# Patient Record
Sex: Female | Born: 1965 | Race: White | Hispanic: No | Marital: Married | Smoking: Former smoker
Health system: Southern US, Community
[De-identification: ages and names within clinical notes are randomized; demographics above are authoritative.]

## PROBLEM LIST (undated history)

## (undated) DIAGNOSIS — K219 Gastro-esophageal reflux disease without esophagitis: Secondary | ICD-10-CM

## (undated) DIAGNOSIS — I10 Essential (primary) hypertension: Secondary | ICD-10-CM

## (undated) DIAGNOSIS — IMO0002 Reserved for concepts with insufficient information to code with codable children: Secondary | ICD-10-CM

## (undated) DIAGNOSIS — R112 Nausea with vomiting, unspecified: Secondary | ICD-10-CM

## (undated) DIAGNOSIS — J45909 Unspecified asthma, uncomplicated: Secondary | ICD-10-CM

## (undated) DIAGNOSIS — Z9889 Other specified postprocedural states: Secondary | ICD-10-CM

## (undated) HISTORY — PX: ABDOMINAL HYSTERECTOMY: SHX81

## (undated) HISTORY — PX: ELBOW SURGERY: SHX618

## (undated) HISTORY — PX: BACK SURGERY: SHX140

---

## 2007-08-06 ENCOUNTER — Emergency Department (HOSPITAL_COMMUNITY): Admission: EM | Admit: 2007-08-06 | Discharge: 2007-08-06 | Payer: Self-pay | Admitting: Emergency Medicine

## 2008-09-02 ENCOUNTER — Emergency Department (HOSPITAL_COMMUNITY): Admission: EM | Admit: 2008-09-02 | Discharge: 2008-09-02 | Payer: Self-pay | Admitting: Emergency Medicine

## 2009-02-13 ENCOUNTER — Ambulatory Visit: Payer: Self-pay | Admitting: Diagnostic Radiology

## 2009-02-13 ENCOUNTER — Emergency Department (HOSPITAL_BASED_OUTPATIENT_CLINIC_OR_DEPARTMENT_OTHER): Admission: EM | Admit: 2009-02-13 | Discharge: 2009-02-13 | Payer: Self-pay | Admitting: Emergency Medicine

## 2009-08-24 ENCOUNTER — Ambulatory Visit: Payer: Self-pay | Admitting: Diagnostic Radiology

## 2009-08-24 ENCOUNTER — Emergency Department (HOSPITAL_BASED_OUTPATIENT_CLINIC_OR_DEPARTMENT_OTHER): Admission: EM | Admit: 2009-08-24 | Discharge: 2009-08-24 | Payer: Self-pay | Admitting: Emergency Medicine

## 2010-04-15 ENCOUNTER — Emergency Department (HOSPITAL_BASED_OUTPATIENT_CLINIC_OR_DEPARTMENT_OTHER): Admission: EM | Admit: 2010-04-15 | Discharge: 2010-04-15 | Payer: Self-pay | Admitting: Emergency Medicine

## 2010-09-04 ENCOUNTER — Emergency Department (HOSPITAL_COMMUNITY): Admission: EM | Admit: 2010-09-04 | Discharge: 2010-09-04 | Payer: Self-pay | Admitting: Emergency Medicine

## 2011-03-09 LAB — COMPREHENSIVE METABOLIC PANEL
CO2: 25 mEq/L (ref 19–32)
Calcium: 9.1 mg/dL (ref 8.4–10.5)
Creatinine, Ser: 0.48 mg/dL (ref 0.4–1.2)
GFR calc non Af Amer: >60 mL/min (ref >60)
Glucose, Bld: 96 mg/dL (ref 70–99)
Total Protein: 7.1 g/dL (ref 6.0–8.3)

## 2011-03-09 LAB — CBC
HCT: 40 % (ref 36.0–46.0)
Hemoglobin: 14.1 g/dL (ref 12.0–15.0)
MCH: 34 pg (ref 26.0–34.0)
MCHC: 35.4 g/dL (ref 30.0–36.0)
RDW: 13.7 % (ref 11.5–15.5)

## 2011-03-09 LAB — DIFFERENTIAL
Lymphocytes Relative: 30 % (ref 12–46)
Lymphs Abs: 3.3 10*3/uL (ref 0.7–4.0)
Monocytes Relative: 5 % (ref 3–12)
Neutro Abs: 6.6 10*3/uL (ref 1.7–7.7)
Neutrophils Relative %: 61 % (ref 43–77)

## 2011-03-09 LAB — LIPASE, BLOOD: Lipase: 32 U/L (ref 11–59)

## 2011-03-14 LAB — URINALYSIS, ROUTINE W REFLEX MICROSCOPIC
Bilirubin Urine: NEGATIVE
Nitrite: NEGATIVE
Specific Gravity, Urine: 1.022 (ref 1.005–1.030)
Urobilinogen, UA: 0.2 mg/dL (ref 0.0–1.0)

## 2011-03-14 LAB — PREGNANCY, URINE: Preg Test, Ur: NEGATIVE

## 2011-04-01 LAB — POCT CARDIAC MARKERS

## 2011-04-01 LAB — D-DIMER, QUANTITATIVE: D-Dimer, Quant: <0.22 ug/mL-FEU (ref 0.00–0.48)

## 2011-09-27 LAB — URINALYSIS, ROUTINE W REFLEX MICROSCOPIC
Glucose, UA: NEGATIVE
Hgb urine dipstick: NEGATIVE
Ketones, ur: NEGATIVE
pH: 6.5

## 2012-02-15 ENCOUNTER — Ambulatory Visit (HOSPITAL_BASED_OUTPATIENT_CLINIC_OR_DEPARTMENT_OTHER)
Admission: RE | Admit: 2012-02-15 | Discharge: 2012-02-15 | Disposition: A | Discharge status: Home / Self Care | Payer: Private Health Insurance - Indemnity | Source: Ambulatory Visit | Attending: Family Medicine | Admitting: Family Medicine

## 2012-02-15 ENCOUNTER — Other Ambulatory Visit (HOSPITAL_BASED_OUTPATIENT_CLINIC_OR_DEPARTMENT_OTHER): Payer: Self-pay | Admitting: Family Medicine

## 2012-02-15 DIAGNOSIS — R1011 Right upper quadrant pain: Secondary | ICD-10-CM | POA: Insufficient documentation

## 2012-08-07 ENCOUNTER — Emergency Department (HOSPITAL_BASED_OUTPATIENT_CLINIC_OR_DEPARTMENT_OTHER)
Admission: EM | Admit: 2012-08-07 | Discharge: 2012-08-07 | Disposition: A | Discharge status: Home / Self Care | Payer: Private Health Insurance - Indemnity | Attending: Emergency Medicine | Admitting: Emergency Medicine

## 2012-08-07 ENCOUNTER — Encounter (HOSPITAL_BASED_OUTPATIENT_CLINIC_OR_DEPARTMENT_OTHER): Payer: Self-pay | Admitting: Emergency Medicine

## 2012-08-07 DIAGNOSIS — M436 Torticollis: Secondary | ICD-10-CM

## 2012-08-07 DIAGNOSIS — F17200 Nicotine dependence, unspecified, uncomplicated: Secondary | ICD-10-CM | POA: Insufficient documentation

## 2012-08-07 DIAGNOSIS — Z90710 Acquired absence of both cervix and uterus: Secondary | ICD-10-CM | POA: Insufficient documentation

## 2012-08-07 DIAGNOSIS — M549 Dorsalgia, unspecified: Secondary | ICD-10-CM | POA: Insufficient documentation

## 2012-08-07 HISTORY — DX: Reserved for concepts with insufficient information to code with codable children: IMO0002

## 2012-08-07 MED ORDER — MELOXICAM 7.5 MG PO TABS: ORAL_TABLET | ORAL | Status: DC

## 2012-08-07 MED ORDER — HYDROCODONE-ACETAMINOPHEN 10-325 MG PO TABS: 1.0000 | ORAL_TABLET | ORAL | Status: AC | PRN

## 2012-08-07 MED ORDER — DIAZEPAM 5 MG PO TABS: ORAL_TABLET | ORAL | Status: DC

## 2012-08-07 NOTE — ED Notes (Signed)
Pt woke up 2 days ago with pain in the left side of her neck.  The next morning it was worse and going into her back.  This morning sts "it took me 45 min to get oob".  Pain going into her left shoulder.

## 2012-08-07 NOTE — ED Provider Notes (Signed)
History     CSN: [id number]  Arrival date & time 08/07/12  1633   First MD Initiated Contact with Patient 08/07/12 1637      Chief Complaint  Patient presents with  . Neck Pain  . Back Pain    (Consider location/radiation/quality/duration/timing/severity/associated sxs/prior treatment) Patient is a 46 y.o. female presenting with neck pain and back pain. The history is provided by the patient.  Neck Pain  This is a new problem. The current episode started more than 2 days ago. The problem occurs constantly. The problem has been gradually worsening. There has been no fever. The pain is present in the left side. The quality of the pain is described as shooting and aching. The pain radiates to the left scapula. The pain is severe. Exacerbated by: sneezing, and certain positions. The pain is the same all the time. Pertinent negatives include no photophobia, no chest pain, no numbness, no headaches, no bowel incontinence and no bladder incontinence.  Back Pain  Pertinent negatives include no chest pain, no numbness, no headaches, no abdominal pain, no bowel incontinence, no bladder incontinence and no dysuria.    Past Medical History  Diagnosis Date  . Ulcer     Past Surgical History  Procedure Date  . Abdominal hysterectomy     No family history on file.  History  Substance Use Topics  . Smoking status: Current Everyday Smoker -- 1.0 packs/day  . Smokeless tobacco: Never Used  . Alcohol Use: No    OB History    Grav Para Term Preterm Abortions TAB SAB Ect Mult Living                  Review of Systems  Constitutional: Negative for activity change.       All ROS Neg except as noted in HPI  HENT: Positive for neck pain. Negative for nosebleeds.   Eyes: Negative for photophobia and discharge.  Respiratory: Negative for cough, shortness of breath and wheezing.   Cardiovascular: Negative for chest pain and palpitations.  Gastrointestinal: Negative for abdominal pain,  blood in stool and bowel incontinence.  Genitourinary: Negative for bladder incontinence, dysuria, frequency and hematuria.  Musculoskeletal: Positive for back pain. Negative for arthralgias.  Skin: Negative.   Neurological: Negative for dizziness, seizures, speech difficulty, numbness and headaches.  Psychiatric/Behavioral: Negative for hallucinations and confusion.    Allergies  Review of patient's allergies indicates no known allergies.  Home Medications   Current Outpatient Rx  Name Route Sig Dispense Refill  . DIAZEPAM 5 MG PO TABS  1 po tid for spasm 21 tablet 0  . HYDROCODONE-ACETAMINOPHEN 10-325 MG PO TABS Oral Take 1 tablet by mouth every 4 (four) hours as needed for pain. 20 tablet 0  . MELOXICAM 7.5 MG PO TABS  1 po bid with food 12 tablet 0    BP 124/75  Pulse 70  Temp 98.6 F (37 C) (Oral)  Resp 18  Ht 5\' 2"  (1.575 m)  Wt 216 lb (97.977 kg)  BMI 39.51 kg/m2  SpO2 99%  Physical Exam  Nursing note and vitals reviewed. Constitutional: She is oriented to person, place, and time. She appears well-developed and well-nourished.  Non-toxic appearance.  HENT:  Head: Normocephalic.  Right Ear: Tympanic membrane and external ear normal.  Left Ear: Tympanic membrane and external ear normal.  Eyes: EOM and lids are normal. Pupils are equal, round, and reactive to light.  Neck: Normal range of motion. Neck supple. Carotid bruit is not present.  Cardiovascular: Normal rate, regular rhythm, normal heart sounds, intact distal pulses and normal pulses.   Pulmonary/Chest: Breath sounds normal. No respiratory distress.  Abdominal: Soft. Bowel sounds are normal. There is no tenderness. There is no guarding.  Musculoskeletal: Normal range of motion.       There is pain to palpation of the left shoulder, extending to the area just below the scapula, there is some tightness and spasm appreciated to palpation. There is full range of motion of the left shoulder. There is no dislocation  or deformity. The capillary refill of the fingers of the left hand is less than 3 seconds, the radial pulses are symmetrical, there no palpable lymph nodes appreciated.  Lymphadenopathy:       Head (right side): No submandibular adenopathy present.       Head (left side): No submandibular adenopathy present.    She has no cervical adenopathy.  Neurological: She is alert and oriented to person, place, and time. She has normal strength. No cranial nerve deficit or sensory deficit.       Grip is symmetrical  Skin: Skin is warm and dry.  Psychiatric: She has a normal mood and affect. Her speech is normal.    ED Course  Procedures (including critical care time)  Labs Reviewed - No data to display No results found.   1. Left torticollis       MDM  I have reviewed nursing notes, vital signs, and all appropriate lab and imaging results for this patient.  Patient reports 3 days of increasing pain from the left neck and shoulder area extending down to below the scapula. The patient is unsure of an actual injury present. No gross neurologic or neurovascular changes appreciated on examination. The plan at this time is to alternate heat and ice. Prescription for Valium 5 mg 3 times daily, Norco 10 mg every 4 hours for pain #20 tablets, and Mobic 7.5 mg 2 times daily with food been given to the patient. The patient is to see orthopedics if not improving in the next 5-7 days.      Kathie Dike, [place] 08/07/12 [phone]

## 2012-08-11 NOTE — ED Provider Notes (Signed)
Medical screening examination/treatment/procedure(s) were performed by non-physician practitioner and as supervising physician I was immediately available for consultation/collaboration.   Shelda Jakes, MD 08/11/12 [phone]

## 2014-01-21 ENCOUNTER — Emergency Department (HOSPITAL_COMMUNITY)
Admission: EM | Admit: 2014-01-21 | Discharge: 2014-01-21 | Disposition: A | Discharge status: Home / Self Care | Payer: BC Managed Care – PPO | Attending: Emergency Medicine | Admitting: Emergency Medicine

## 2014-01-21 ENCOUNTER — Emergency Department (HOSPITAL_COMMUNITY): Payer: BC Managed Care – PPO

## 2014-01-21 ENCOUNTER — Encounter (HOSPITAL_COMMUNITY): Payer: Self-pay | Admitting: Emergency Medicine

## 2014-01-21 DIAGNOSIS — F411 Generalized anxiety disorder: Secondary | ICD-10-CM | POA: Insufficient documentation

## 2014-01-21 DIAGNOSIS — Z79899 Other long term (current) drug therapy: Secondary | ICD-10-CM | POA: Insufficient documentation

## 2014-01-21 DIAGNOSIS — R110 Nausea: Secondary | ICD-10-CM | POA: Insufficient documentation

## 2014-01-21 DIAGNOSIS — H81399 Other peripheral vertigo, unspecified ear: Secondary | ICD-10-CM

## 2014-01-21 DIAGNOSIS — F17200 Nicotine dependence, unspecified, uncomplicated: Secondary | ICD-10-CM | POA: Insufficient documentation

## 2014-01-21 DIAGNOSIS — R0789 Other chest pain: Secondary | ICD-10-CM

## 2014-01-21 DIAGNOSIS — Z872 Personal history of diseases of the skin and subcutaneous tissue: Secondary | ICD-10-CM | POA: Insufficient documentation

## 2014-01-21 DIAGNOSIS — R42 Dizziness and giddiness: Secondary | ICD-10-CM | POA: Insufficient documentation

## 2014-01-21 DIAGNOSIS — H538 Other visual disturbances: Secondary | ICD-10-CM | POA: Insufficient documentation

## 2014-01-21 DIAGNOSIS — M542 Cervicalgia: Secondary | ICD-10-CM | POA: Insufficient documentation

## 2014-01-21 LAB — TROPONIN I

## 2014-01-21 LAB — CBC
HCT: 41.1 % (ref 36.0–46.0)
Hemoglobin: 14.1 g/dL (ref 12.0–15.0)
MCH: 33.2 pg (ref 26.0–34.0)
MCHC: 34.3 g/dL (ref 30.0–36.0)
MCV: 96.7 fL (ref 78.0–100.0)
PLATELETS: 279 10*3/uL (ref 150–400)
RBC: 4.25 MIL/uL (ref 3.87–5.11)
RDW: 13.5 % (ref 11.5–15.5)
WBC: 12.7 10*3/uL — AB (ref 4.0–10.5)

## 2014-01-21 LAB — BASIC METABOLIC PANEL
BUN: 10 mg/dL (ref 6–23)
CO2: 25 meq/L (ref 19–32)
CREATININE: 0.51 mg/dL (ref 0.50–1.10)
Calcium: 9.3 mg/dL (ref 8.4–10.5)
Chloride: 102 mEq/L (ref 96–112)
GFR calc non Af Amer: >90 mL/min (ref >90)
Glucose, Bld: 108 mg/dL — ABNORMAL HIGH (ref 70–99)
POTASSIUM: 3.7 meq/L (ref 3.7–5.3)
SODIUM: 140 meq/L (ref 137–147)

## 2014-01-21 LAB — POCT I-STAT TROPONIN I: TROPONIN I, POC: 0 ng/mL (ref 0.00–0.08)

## 2014-01-21 LAB — PRO B NATRIURETIC PEPTIDE: Pro B Natriuretic peptide (BNP): 30.8 pg/mL (ref 0–125)

## 2014-01-21 MED ORDER — MECLIZINE HCL 25 MG PO TABS
25.0000 mg | ORAL_TABLET | Freq: Once | ORAL | Status: AC
  Administered 2014-01-21: 25 mg via ORAL
  Filled 2014-01-21: qty 1

## 2014-01-21 MED ORDER — MORPHINE SULFATE 4 MG/ML IJ SOLN
4.0000 mg | Freq: Once | INTRAMUSCULAR | Status: AC
  Administered 2014-01-21: 4 mg via INTRAVENOUS
  Filled 2014-01-21: qty 1

## 2014-01-21 MED ORDER — METOCLOPRAMIDE HCL 5 MG/ML IJ SOLN
10.0000 mg | Freq: Once | INTRAMUSCULAR | Status: AC
  Administered 2014-01-21: 10 mg via INTRAVENOUS
  Filled 2014-01-21: qty 2

## 2014-01-21 MED ORDER — MECLIZINE HCL 25 MG PO TABS: 25.0000 mg | ORAL_TABLET | Freq: Three times a day (TID) | ORAL | Status: DC | PRN

## 2014-01-21 NOTE — ED Notes (Signed)
Pt states that she has had centralized chest pain/pressure since 7 am.  States that she has felt weak and dizzy for about a week.  States that it feels like a 300 lb gorilla is sitting on her chest.  CP radiates to rt side of neck and back.  Also states that the rt side of her face "feels funny".

## 2014-01-21 NOTE — ED Provider Notes (Signed)
CSN: [id number]     Arrival date & time 01/21/14  1240 History   First MD Initiated Contact with Patient 01/21/14 1337     Chief Complaint  Patient presents with  . Chest Pain  . Nausea  . Neck Pain   (Consider location/radiation/quality/duration/timing/severity/associated sxs/prior Treatment) Patient is a 48 y.o. female presenting with chest pain and neck pain.  Chest Pain Associated symptoms: no abdominal pain, no cough and no fever   Neck Pain Associated symptoms: chest pain   Associated symptoms: no fever    48 yo female presents with "Chest Tightness" that started today at 7 am. Pain rated at 8/10 and constant. Patient states pain no worse with activity vs rest. Denies SOB. Admits to hands tingling. Patient states she also experienced some dizziness today that made her hang on to something because it felt like "the world was spinning around me". Patient admits to associated Nausea, HA, and blurred vision. Denies any vomiting or abdominal pain. Patient also admits to 2 week hx of Right sided neck pain that radiates to right ear. Patient also admits to associated weakness over the past week.  Advanced age > 46 yo: No HTN: No Hyperlipidemia: Yes Cigarette smoking: Yes Diabetes Mellitus: No Family hx of CAD or MI < 46 yo : Yes  Female or Post menopausal: No Cocaine use: No Prior MI: No CABG: No Stress test: No     Past Medical History  Diagnosis Date  . Ulcer    Past Surgical History  Procedure Laterality Date  . Abdominal hysterectomy     History reviewed. No pertinent family history. History  Substance Use Topics  . Smoking status: Current Every Day Smoker -- 1.00 packs/day  . Smokeless tobacco: Never Used  . Alcohol Use: No   OB History   Grav Para Term Preterm Abortions TAB SAB Ect Mult Living                 Review of Systems  Constitutional: Negative for fever and chills.  HENT: Negative for hearing loss.   Eyes: Positive for visual disturbance.   Respiratory: Positive for chest tightness. Negative for cough.   Cardiovascular: Positive for chest pain.  Gastrointestinal: Negative for abdominal pain.  Musculoskeletal: Positive for neck pain.  Skin: Negative for rash.  Neurological: Negative for syncope, facial asymmetry and speech difficulty.  Psychiatric/Behavioral: Negative for confusion. The patient is nervous/anxious.   All other systems reviewed and are negative.    Allergies  Review of patient's allergies indicates no known allergies.  Home Medications   Current Outpatient Rx  Name  Route  Sig  Dispense  Refill  . albuterol (PROVENTIL HFA;VENTOLIN HFA) 108 (90 BASE) MCG/ACT inhaler   Inhalation   Inhale 2 puffs into the lungs every 6 (six) hours as needed for wheezing or shortness of breath.         Marland Kitchen. ibuprofen (ADVIL,MOTRIN) 200 MG tablet   Oral   Take 400 mg by mouth every 6 (six) hours as needed.          BP 119/80  Pulse 63  Temp(Src) 98 F (36.7 C) (Oral)  Resp 18  SpO2 100% Physical Exam  Nursing note and vitals reviewed. Constitutional: She is oriented to person, place, and time. She appears well-developed and well-nourished. No distress.  HENT:  Head: Normocephalic and atraumatic.  Right Ear: Tympanic membrane and ear canal normal.  Left Ear: Tympanic membrane and ear canal normal.  Nose: Nose normal.  Mouth/Throat: Uvula is  midline, oropharynx is clear and moist and mucous membranes are normal.  Eyes: Conjunctivae and EOM are normal. Pupils are equal, round, and reactive to light. Right eye exhibits no nystagmus. Left eye exhibits no nystagmus.  Neck: Normal range of motion. Neck supple. No JVD present.  Cardiovascular: Normal rate and regular rhythm.  Exam reveals no gallop and no friction rub.   No murmur heard. Pulmonary/Chest: Effort normal and breath sounds normal. No respiratory distress. She has no wheezes. She has no rales.  Abdominal: Soft. Bowel sounds are normal. She exhibits no  distension. There is no tenderness. There is no guarding.  Musculoskeletal: Normal range of motion. She exhibits no edema.  Neurological: She is alert and oriented to person, place, and time. She has normal strength. No cranial nerve deficit or sensory deficit.  CN II-XII appear grossly intact. Normal Cerebellar function with finger to nose. Patient ambulates without assistance in ED.   Dizziness reproducible on exam with Dix halpike maneuver. Patient became dizzy and nauseated with patient being quickly laid back and head turned towards left side. Symptoms resolved with sitting up.   Skin: Skin is warm and dry. She is not diaphoretic.  Psychiatric: She has a normal mood and affect. Her behavior is normal.    ED Course  Procedures (including critical care time) Labs Review Labs Reviewed  CBC - Abnormal; Notable for the following:    WBC 12.7 (*)    All other components within normal limits  BASIC METABOLIC PANEL - Abnormal; Notable for the following:    Glucose, Bld 108 (*)    All other components within normal limits  PRO B NATRIURETIC PEPTIDE  TROPONIN I  POCT I-STAT TROPONIN I   Imaging Review Dg Chest 2 View  01/21/2014   CLINICAL DATA:  Chest pain and shortness of breath.  EXAM: CHEST  2 VIEW  COMPARISON:  09/04/2010  FINDINGS: Again noted are slightly prominent lung markings. There is no focal airspace disease and no overt pulmonary edema. Heart size is normal. The trachea is midline. Bony thorax is intact.  IMPRESSION: No acute chest abnormality.   Electronically Signed   By: Richarda Overlie M.D.   On: 01/21/2014 14:19    EKG Interpretation    Date/Time:  Wednesday January 21 2014 12:49:43 EST Ventricular Rate:  69 PR Interval:  116 QRS Duration: 88 QT Interval:  403 QTC Calculation: 432 R Axis:   58 Text Interpretation:  Sinus rhythm Borderline short PR interval Low voltage, precordial leads Baseline wander in lead(s) V1 V2 V5 V6 No significant change since last tracing  Confirmed by KNAPP  MD-J, JON ([id number]) on 01/21/2014 1:42:22 PM            MDM   1. Chest tightness   2. Peripheral vertigo    Patient afebrile with normal VS.  EKG shows Normal sinus rhythm BNP WNL CXR negative Delta Troponin Negative  HEART score - 3 TIMI score - 1  PERC negative Low suspicion for myocardial ischemia. ? Possible anxiety/panic attack given patient sxs in HPI.  Plan to have patient follow up with Cardiology for a stress test on outpatient basis. Advised patient to quit smoking.  Mild Leukocytosis, though no signs of infection are evident. Patient reports hx of Leukocytosis.   Patient's Vertigo and Nausea markedly improved with Meclizine and Reglan. Discussed etiologies of Peripheral Vertigo with patient and advised follow up with PCP for maintenance of therapy of vertigo and evaluation of weakness sxs. Patient agrees with plan.  Discharged in good condition.   Meds given in ED:  Medications  meclizine (ANTIVERT) tablet 25 mg (25 mg Oral Given 01/21/14 1421)  metoCLOPramide (REGLAN) injection 10 mg (10 mg Intravenous Given 01/21/14 1421)  morphine 4 MG/ML injection 4 mg (4 mg Intravenous Given 01/21/14 1547)    New Prescriptions   MECLIZINE (ANTIVERT) 25 MG TABLET    Take 1 tablet (25 mg total) by mouth 3 (three) times daily as needed for dizziness.             Rudene Anda, PA-C 01/22/14 1039

## 2014-01-21 NOTE — ED Notes (Signed)
PA at bedside.

## 2014-01-21 NOTE — Discharge Instructions (Signed)
Call Cardiology office today or tomorrow to set up appointment for cardiac stress test. Follow up with your PCP within 1 week for evaluation of weakness and continued therapy for vertigo symptoms. Return to ED should you develop worsening chest pain or shortness of breath. Avoid strenuous activity prior to follow up with cardiology.

## 2014-01-21 NOTE — ED Provider Notes (Signed)
Pt chief complaint was chest pressure in the center of her chest from 7 am to 2 pm.  The symptoms were constant.  It was not radiating.  It was a steady pressure.  She did have nausea and felt dizzy and her vision felt blurry.  She also has had a headache. Father with MI in his 4450s.  Pt does smoke cigarettes.  She does have elevated cholesterol.   Several hours of constant pain with negative troponin.  Plan on second enzyme.  If normal. Stable for outpatient follow up with pcp.  Pt is comfortable with plan.  EKG Interpretation    Date/Time:  Wednesday January 21 2014 12:49:43 EST Ventricular Rate:  69 PR Interval:  116 QRS Duration: 88 QT Interval:  403 QTC Calculation: 432 R Axis:   58 Text Interpretation:  Sinus rhythm Borderline short PR interval Low voltage, precordial leads Baseline wander in lead(s) V1 V2 V5 V6 No significant change since last tracing Confirmed by Lakshya Mcgillicuddy  MD-J, Jaisa Defino ([id number]) on 01/21/2014 1:42:22 PM             Celene KrasJon R Chrstopher Malenfant, MD 01/21/14 1649

## 2014-01-21 NOTE — ED Notes (Signed)
Patient transported to X-ray 

## 2015-03-30 ENCOUNTER — Encounter (HOSPITAL_COMMUNITY): Payer: Self-pay | Admitting: Emergency Medicine

## 2015-03-30 ENCOUNTER — Emergency Department (HOSPITAL_COMMUNITY)
Admission: EM | Admit: 2015-03-30 | Discharge: 2015-03-30 | Disposition: A | Discharge status: Home / Self Care | Payer: BLUE CROSS/BLUE SHIELD | Attending: Emergency Medicine | Admitting: Emergency Medicine

## 2015-03-30 ENCOUNTER — Emergency Department (HOSPITAL_COMMUNITY): Payer: BLUE CROSS/BLUE SHIELD

## 2015-03-30 DIAGNOSIS — Z79899 Other long term (current) drug therapy: Secondary | ICD-10-CM | POA: Insufficient documentation

## 2015-03-30 DIAGNOSIS — R519 Headache, unspecified: Secondary | ICD-10-CM

## 2015-03-30 DIAGNOSIS — R51 Headache: Secondary | ICD-10-CM | POA: Insufficient documentation

## 2015-03-30 DIAGNOSIS — Z8719 Personal history of other diseases of the digestive system: Secondary | ICD-10-CM | POA: Insufficient documentation

## 2015-03-30 DIAGNOSIS — R220 Localized swelling, mass and lump, head: Secondary | ICD-10-CM | POA: Diagnosis present

## 2015-03-30 DIAGNOSIS — Z791 Long term (current) use of non-steroidal anti-inflammatories (NSAID): Secondary | ICD-10-CM | POA: Diagnosis not present

## 2015-03-30 DIAGNOSIS — Z720 Tobacco use: Secondary | ICD-10-CM | POA: Diagnosis not present

## 2015-03-30 DIAGNOSIS — J019 Acute sinusitis, unspecified: Secondary | ICD-10-CM | POA: Diagnosis not present

## 2015-03-30 LAB — I-STAT CHEM 8, ED
BUN: 5 mg/dL — ABNORMAL LOW (ref 6–23)
Calcium, Ion: 1.15 mmol/L (ref 1.12–1.23)
Chloride: 107 mmol/L (ref 96–112)
Creatinine, Ser: 0.5 mg/dL (ref 0.50–1.10)
Glucose, Bld: 91 mg/dL (ref 70–99)
HCT: 45 % (ref 36.0–46.0)
Hemoglobin: 15.3 g/dL — ABNORMAL HIGH (ref 12.0–15.0)
Potassium: 3.3 mmol/L — ABNORMAL LOW (ref 3.5–5.1)
Sodium: 143 mmol/L (ref 135–145)
TCO2: 18 mmol/L (ref 0–100)

## 2015-03-30 LAB — CBC
HEMATOCRIT: 42.4 % (ref 36.0–46.0)
Hemoglobin: 14.2 g/dL (ref 12.0–15.0)
MCH: 32.8 pg (ref 26.0–34.0)
MCHC: 33.5 g/dL (ref 30.0–36.0)
MCV: 97.9 fL (ref 78.0–100.0)
Platelets: 286 10*3/uL (ref 150–400)
RBC: 4.33 MIL/uL (ref 3.87–5.11)
RDW: 13.3 % (ref 11.5–15.5)
WBC: 9.1 10*3/uL (ref 4.0–10.5)

## 2015-03-30 LAB — SEDIMENTATION RATE: Sed Rate: 36 mm/hr — ABNORMAL HIGH (ref 0–22)

## 2015-03-30 MED ORDER — LORATADINE 10 MG PO TABS: 10.0000 mg | ORAL_TABLET | Freq: Every day | ORAL | Status: DC

## 2015-03-30 MED ORDER — DIPHENHYDRAMINE HCL 50 MG/ML IJ SOLN: 25.0000 mg | Freq: Once | INTRAMUSCULAR | Status: DC

## 2015-03-30 MED ORDER — SALINE SPRAY 0.65 % NA SOLN: 1.0000 | NASAL | Status: DC | PRN

## 2015-03-30 MED ORDER — DEXAMETHASONE SODIUM PHOSPHATE 10 MG/ML IJ SOLN
10.0000 mg | Freq: Once | INTRAMUSCULAR | Status: AC
  Administered 2015-03-30: 10 mg via INTRAVENOUS
  Filled 2015-03-30: qty 1

## 2015-03-30 MED ORDER — IOHEXOL 300 MG/ML  SOLN
100.0000 mL | Freq: Once | INTRAMUSCULAR | Status: AC | PRN
  Administered 2015-03-30: 100 mL via INTRAVENOUS

## 2015-03-30 MED ORDER — DIPHENHYDRAMINE HCL 50 MG/ML IJ SOLN
12.5000 mg | Freq: Once | INTRAMUSCULAR | Status: AC
  Administered 2015-03-30: 12.5 mg via INTRAVENOUS
  Filled 2015-03-30: qty 1

## 2015-03-30 MED ORDER — KETOROLAC TROMETHAMINE 30 MG/ML IJ SOLN
30.0000 mg | Freq: Once | INTRAMUSCULAR | Status: AC
  Administered 2015-03-30: 30 mg via INTRAVENOUS
  Filled 2015-03-30: qty 1

## 2015-03-30 MED ORDER — METOCLOPRAMIDE HCL 5 MG/ML IJ SOLN
10.0000 mg | INTRAMUSCULAR | Status: AC
  Administered 2015-03-30: 10 mg via INTRAVENOUS
  Filled 2015-03-30: qty 2

## 2015-03-30 MED ORDER — NAPROXEN 500 MG PO TABS: 500.0000 mg | ORAL_TABLET | Freq: Two times a day (BID) | ORAL | Status: DC

## 2015-03-30 MED ORDER — AMOXICILLIN-POT CLAVULANATE 875-125 MG PO TABS: 1.0000 | ORAL_TABLET | Freq: Two times a day (BID) | ORAL | Status: DC

## 2015-03-30 NOTE — Discharge Instructions (Signed)
Sinusitis °Sinusitis is redness, soreness, and inflammation of the paranasal sinuses. Paranasal sinuses are air pockets within the bones of your face (beneath the eyes, the middle of the forehead, or above the eyes). In healthy paranasal sinuses, mucus is able to drain out, and air is able to circulate through them by way of your nose. However, when your paranasal sinuses are inflamed, mucus and air can become trapped. This can allow bacteria and other germs to grow and cause infection. °Sinusitis can develop quickly and last only a short time (acute) or continue over a long period (chronic). Sinusitis that lasts for more than 12 weeks is considered chronic.  °CAUSES  °Causes of sinusitis include: °· Allergies. °· Structural abnormalities, such as displacement of the cartilage that separates your nostrils (deviated septum), which can decrease the air flow through your nose and sinuses and affect sinus drainage. °· Functional abnormalities, such as when the small hairs (cilia) that line your sinuses and help remove mucus do not work properly or are not present. °SIGNS AND SYMPTOMS  °Symptoms of acute and chronic sinusitis are the same. The primary symptoms are pain and pressure around the affected sinuses. Other symptoms include: °· Upper toothache. °· Earache. °· Headache. °· Bad breath. °· Decreased sense of smell and taste. °· A cough, which worsens when you are lying flat. °· Fatigue. °· Fever. °· Thick drainage from your nose, which often is green and may contain pus (purulent). °· Swelling and warmth over the affected sinuses. °DIAGNOSIS  °Your health care provider will perform a physical exam. During the exam, your health care provider may: °· Look in your nose for signs of abnormal growths in your nostrils (nasal polyps). °· Tap over the affected sinus to check for signs of infection. °· View the inside of your sinuses (endoscopy) using an imaging device that has a light attached (endoscope). °If your health  care provider suspects that you have chronic sinusitis, one or more of the following tests may be recommended: °· Allergy tests. °· Nasal culture. A sample of mucus is taken from your nose, sent to a lab, and screened for bacteria. °· Nasal cytology. A sample of mucus is taken from your nose and examined by your health care provider to determine if your sinusitis is related to an allergy. °TREATMENT  °Most cases of acute sinusitis are related to a viral infection and will resolve on their own within 10 days. Sometimes medicines are prescribed to help relieve symptoms (pain medicine, decongestants, nasal steroid sprays, or saline sprays).  °However, for sinusitis related to a bacterial infection, your health care provider will prescribe antibiotic medicines. These are medicines that will help kill the bacteria causing the infection.  °Rarely, sinusitis is caused by a fungal infection. In theses cases, your health care provider will prescribe antifungal medicine. °For some cases of chronic sinusitis, surgery is needed. Generally, these are cases in which sinusitis recurs more than 3 times per year, despite other treatments. °HOME CARE INSTRUCTIONS  °· Drink plenty of water. Water helps thin the mucus so your sinuses can drain more easily. °· Use a humidifier. °· Inhale steam 3 to 4 times a day (for example, sit in the bathroom with the shower running). °· Apply a warm, moist washcloth to your face 3 to 4 times a day, or as directed by your health care provider. °· Use saline nasal sprays to help moisten and clean your sinuses. °· Take medicines only as directed by your health care provider. °·   If you were prescribed either an antibiotic or antifungal medicine, finish it all even if you start to feel better. °SEEK IMMEDIATE MEDICAL CARE IF: °· You have increasing pain or severe headaches. °· You have nausea, vomiting, or drowsiness. °· You have swelling around your face. °· You have vision problems. °· You have a stiff  neck. °· You have difficulty breathing. °MAKE SURE YOU:  °· Understand these instructions. °· Will watch your condition. °· Will get help right away if you are not doing well or get worse. °Document Released: 12/11/2005 Document Revised: 04/27/2014 Document Reviewed: 12/26/2011 °[employer]® Patient Information ©2015 [employer]. This information is not intended to replace advice given to you by your health care provider. Make sure you discuss any questions you have with your health care provider. ° °Sinus Headache °A sinus headache is when your sinuses become clogged or swollen. Sinus headaches can range from mild to severe.  °CAUSES °A sinus headache can have different causes, such as: °· Colds. °· Sinus infections. °· Allergies. °SYMPTOMS  °Symptoms of a sinus headache may vary and can include: °· Headache. °· Pain or pressure in the face. °· Congested or runny nose. °· Fever. °· Inability to smell. °· Pain in upper teeth. °Weather changes can make symptoms worse. °TREATMENT  °The treatment of a sinus headache depends on the cause. °· Sinus pain caused by a sinus infection may be treated with antibiotic medicine. °· Sinus pain caused by allergies may be helped by allergy medicines (antihistamines) and medicated nasal sprays. °· Sinus pain caused by congestion may be helped by flushing the nose and sinuses with saline solution. °HOME CARE INSTRUCTIONS  °· If antibiotics are prescribed, take them as directed. Finish them even if you start to feel better. °· Only take over-the-counter or prescription medicines for pain, discomfort, or fever as directed by your caregiver. °· If you have congestion, use a nasal spray to help reduce pressure. °SEEK IMMEDIATE MEDICAL CARE IF: °· You have a fever. °· You have headaches more than once a week. °· You have sensitivity to light or sound. °· You have repeated nausea and vomiting. °· You have vision problems. °· You have sudden, severe pain in your face or head. °· You have a  seizure. °· You are confused. °· Your sinus headaches do not get better after treatment. Many people think they have a sinus headache when they actually have migraines or tension headaches. °MAKE SURE YOU:  °· Understand these instructions. °· Will watch your condition. °· Will get help right away if you are not doing well or get worse. °Document Released: 01/18/2005 Document Revised: 03/04/2012 Document Reviewed: 03/11/2011 °[employer]® Patient Information ©2015 [employer]. This information is not intended to replace advice given to you by your health care provider. Make sure you discuss any questions you have with your health care provider. ° °

## 2015-03-30 NOTE — ED Provider Notes (Signed)
CSN: [id number]     Arrival date & time 03/30/15  2020 History  This chart was scribed for Antony MaduraKelly Xin Klawitter, PA-C, working with Purvis SheffieldForrest Harrison, MD by Elon SpannerGarrett Cook, ED Scribe. This patient was seen in room WTR5/WTR5 and the patient's care was started at 8:28 PM.   Chief Complaint  Patient presents with  . Facial Swelling  . Jaw Pain   The history is provided by the patient. No language interpreter was used.   HPI Comments: Wendy Lynn is a 49 y.o. female who presents to the Emergency Department complaining of worsening left-sided facial and jaw pain onset 3 weeks ago with radiation down the left neck yesterday and associated left-sided facial swelling onset yesterday as well as a left frontal headache onset 2 days ago.  She also notes increased dry mouth in the mornings recently, left eye tearing/matting throughout the day worse today and treated with warm compress, difficulty breathing through left nostril, and increased snoring.  She also notes a cough normal to baseline.  Patient reports she was seen by her PCP who suspected TMJ and referred the patient to a dentist.  Neither her PCP nor dentist found any abnormalities surrounding her area of complaint.  Patient has taken Augmentin, doxycycline, and prednisone.  She reports moderate, transient relief with the prednisone with no relief of symptoms from abx usage.  She has not had any labs performed.  Patient denies history of HTN, but reports she measured an elevated BP yesterday.  Patient reports a history of migraines but reports this does not feel like a typical migraine.  Patient denies hearing changes, vision changes, sore throat, trouble swallowing, drooling SOB, numbness, weakness, fever, LOC, eye redness, CP. Cough normal to baseline from smoking.          Past Medical History  Diagnosis Date  . Ulcer    Past Surgical History  Procedure Laterality Date  . Abdominal hysterectomy     No family history on file. History  Substance Use Topics   . Smoking status: Current Every Day Smoker -- 1.00 packs/day  . Smokeless tobacco: Never Used  . Alcohol Use: No   OB History    No data available      Review of Systems  Constitutional: Negative for fever.  HENT: Positive for facial swelling. Negative for drooling, sore throat and trouble swallowing.   Eyes: Positive for discharge. Negative for redness and visual disturbance.  Respiratory: Positive for cough.   Neurological: Negative for weakness and numbness.    Allergies  Review of patient's allergies indicates no known allergies.  Home Medications   Prior to Admission medications   Medication Sig Start Date End Date Taking? Authorizing Provider  albuterol (PROVENTIL HFA;VENTOLIN HFA) 108 (90 BASE) MCG/ACT inhaler Inhale 2 puffs into the lungs every 6 (six) hours as needed for wheezing or shortness of breath.   Yes Historical Provider, MD  gabapentin (NEURONTIN) 300 MG capsule Take 300 mg by mouth every morning. 02/12/15  Yes Historical Provider, MD  ibuprofen (ADVIL,MOTRIN) 200 MG tablet Take 400 mg by mouth every 6 (six) hours as needed.   Yes Historical Provider, MD  topiramate (TOPAMAX) 50 MG tablet Take 100 mg by mouth daily. 02/25/15  Yes Historical Provider, MD  venlafaxine XR (EFFEXOR-XR) 75 MG 24 hr capsule Take 75 mg by mouth daily. 02/25/15  Yes Historical Provider, MD  amoxicillin-clavulanate (AUGMENTIN) 875-125 MG per tablet Take 1 tablet by mouth every 12 (twelve) hours. 03/30/15   Antony MaduraKelly Ezeriah Luty, PA-C  loratadine (  CLARITIN) 10 MG tablet Take 1 tablet (10 mg total) by mouth daily. 03/30/15   Antony Madura, PA-C  meclizine (ANTIVERT) 25 MG tablet Take 1 tablet (25 mg total) by mouth 3 (three) times daily as needed for dizziness. Patient not taking: Reported on 03/30/2015 01/21/14   Cristobal Goldmann, PA-C  naproxen (NAPROSYN) 500 MG tablet Take 1 tablet (500 mg total) by mouth 2 (two) times daily. 03/30/15   Antony Madura, PA-C  sodium chloride (OCEAN) 0.65 % SOLN nasal spray Place 1 spray  into both nostrils as needed for congestion. 03/30/15   Antony Madura, PA-C   BP 112/67 mmHg  Pulse 51  Temp(Src) 97.5 F (36.4 C) (Oral)  Resp 16  SpO2 96%   Physical Exam  Constitutional: She is oriented to person, place, and time. She appears well-developed and well-nourished. No distress.  Nontoxic/nonseptic appearing  HENT:  Head: Normocephalic and atraumatic.    Right Ear: Tympanic membrane, external ear and ear canal normal. No decreased hearing is noted.  Left Ear: Tympanic membrane, external ear and ear canal normal. No decreased hearing is noted.  Nose: Nose normal. No septal deviation or nasal septal hematoma.  Facial swelling noted medial and inferior to the L eye with mild TTP. No erythema. B/l nares patent. No septal deviation or hematoma. Oropharynx clear. No dental tenderness to palpation. No gingival swelling or fluctuance. No trismus. Uvula midline. Patient tolerating secretions without difficulty.  Eyes: Conjunctivae and EOM are normal. Pupils are equal, round, and reactive to light. No scleral icterus.  Mild ptosis of the L upper eyelid. PERRL. Visual acuity intact. No proptosis or hyphema. Snellen 20/20 OD and 20/30 OS with corrective lenses.  Neck: Normal range of motion. Neck supple.  Tender left anterior cervical lymphadenopathy; mild. No nuchal rigidity or meningismus.  Cardiovascular: Normal rate, regular rhythm and intact distal pulses.   No carotid bruits b/l  Pulmonary/Chest: Effort normal and breath sounds normal. No respiratory distress. She has no wheezes. She has no rales.  Respirations even and unlabored. Lungs CTAB.  Musculoskeletal: Normal range of motion.  Neurological: She is alert and oriented to person, place, and time. No cranial nerve deficit. She exhibits normal muscle tone. Coordination normal.  GCS 15. No focal neurologic deficits appreciated. Eyebrow raise symmetric. Patient moves extremities without ataxia. She ambulates with normal gait.   Skin: Skin is warm and dry. No rash noted. She is not diaphoretic. No erythema. No pallor.  Psychiatric: She has a normal mood and affect. Her behavior is normal.  Nursing note and vitals reviewed.   ED Course  Procedures (including critical care time)  DIAGNOSTIC STUDIES: Oxygen Saturation is 99% on RA, normal by my interpretation.    COORDINATION OF CARE:  8:46 PM Discussed treatment plan with patient at bedside.  Patient acknowledges and agrees with plan.    Labs Review Labs Reviewed  SEDIMENTATION RATE - Abnormal; Notable for the following:    Sed Rate 36 (*)    All other components within normal limits  I-STAT CHEM 8, ED - Abnormal; Notable for the following:    Potassium 3.3 (*)    BUN 5 (*)    Hemoglobin 15.3 (*)    All other components within normal limits  CBC    Imaging Review Dg Chest 2 View  03/30/2015   CLINICAL DATA:  Smoker.  Left facial swelling since yesterday.  EXAM: CHEST  2 VIEW  COMPARISON:  01/21/2014.  FINDINGS: Normal sized heart. Clear lungs. Stable mild diffuse peribronchial  thickening. No intrathoracic mass seen. Mild thoracic spine degenerative changes.  IMPRESSION: No acute abnormality.  Stable mild chronic bronchitic changes.   Electronically Signed   By: Beckie Salts M.D.   On: 03/30/2015 21:48   Ct Head W Wo Contrast  03/30/2015   CLINICAL DATA:  Worsening left-sided facial and jaw pain, onset 3 weeks ago. There is radiation down the left neck yesterday with left-sided facial swelling. Left frontal headache, onset 2 days ago. Left eye tearing.  EXAM: CT HEAD WITHOUT AND WITH CONTRAST  CT NECK WITH CONTRAST  TECHNIQUE: Contiguous axial images were obtained from the base of the skull through the vertex without and with intravenous contrast  Multidetector CT imaging of the and neck was performed using the standard protocol following the bolus administration of intravenous contrast.  CONTRAST:  OMNIPAQUE IOHEXOL 300 MG/ML  SOLN  COMPARISON:   08/24/2009 head CT  FINDINGS: CT HEAD FINDINGS  Skull and Sinuses:Facial findings are described below. No calvarial fracture.  Orbits: No acute abnormality.  Brain: No evidence of acute infarction, hemorrhage, hydrocephalus, or mass lesion/mass effect. There is no concerning intracranial enhancement. A developmental venous anomaly is noted in the upper left cerebellum, an incidental finding. The dural venous sinuses are patent. There is no asymmetric enhancement in the orbits or cavernous sinuses to explain the left eye symptoms.  CT NECK FINDINGS  There is no abnormal enhancement or asymmetry in the pharynx or larynx. The salivary and thyroid glands are normal. No suspicious lymph nodes. Prominent temporal veins, notable given the history, but symmetric and considered normal. The orbits are normal, with no evidence of globe pathology, extraocular muscle thickening, or superior ophthalmic vein distension. A small fluid level is noted within the left maxillary sinus.  Attempted fenestration of the basilar artery.  IMPRESSION: 1. Left maxillary sinusitis. 2. 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 Otherwise, intracranial imaging is unremarkable.   Electronically Signed   By: Marnee Spring M.D.   On: 03/30/2015 22:25   Ct Soft Tissue Neck W Contrast  03/30/2015   CLINICAL DATA:  Worsening left-sided facial and jaw pain, onset 3 weeks ago. There is radiation down the left neck yesterday with left-sided facial swelling. Left frontal headache, onset 2 days ago. Left eye tearing.  EXAM: CT HEAD WITHOUT AND WITH CONTRAST  CT NECK WITH CONTRAST  TECHNIQUE: Contiguous axial images were obtained from the base of the skull through the vertex without and with intravenous contrast  Multidetector CT imaging of the and neck was performed using the standard protocol following the bolus administration of intravenous contrast.  CONTRAST:  OMNIPAQUE IOHEXOL 300 MG/ML  SOLN  COMPARISON:  08/24/2009 head  CT  FINDINGS: CT HEAD FINDINGS  Skull and Sinuses:Facial findings are described below. No calvarial fracture.  Orbits: No acute abnormality.  Brain: No evidence of acute infarction, hemorrhage, hydrocephalus, or mass lesion/mass effect. There is no concerning intracranial enhancement. A developmental venous anomaly is noted in the upper left cerebellum, an incidental finding. The dural venous sinuses are patent. There is no asymmetric enhancement in the orbits or cavernous sinuses to explain the left eye symptoms.  CT NECK FINDINGS  There is no abnormal enhancement or asymmetry in the pharynx or larynx. The salivary and thyroid glands are normal. No suspicious lymph nodes. Prominent temporal veins, notable given the history, but symmetric and considered normal. The orbits are normal, with no evidence of globe pathology, extraocular muscle thickening, or superior ophthalmic vein distension. A small  fluid level is noted within the left maxillary sinus.  Attempted fenestration of the basilar artery.  IMPRESSION: 1. Left maxillary sinusitis. 2. 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 Otherwise, intracranial imaging is unremarkable.   Electronically Signed   By: Marnee Spring M.D.   On: 03/30/2015 22:25     EKG Interpretation None      MDM   Final diagnoses:  Subacute sinusitis, unspecified location  Sinus headache    49 year old female presents to the emergency department for further evaluation of left-sided facial swelling and soreness 3 weeks. No evidence of dental abscess. No red flags or signs concerning for Ludwig's angina. No fever, nuchal rigidity, or meningismus to suggest meningitis. Superior vena cava syndrome considered, but chest x-ray today is negative. Patient has no complaints of chest pain or shortness of breath. Cavernous sinus thrombosis also considered, however, there is no evidence of this on CT scan today. CT negative for acute/emergent intracranial process.  There is evidence of L maxillary sinusitis. Suspect symptoms are secondary to this, contributing towards a L sided sinus headache.  Patient given migraine cocktail for symptoms. Will place patient on a course of Augmentin as well as Claritin. Patient given naproxen and Ocean saline spray for further management of symptoms. Have advised primary care follow-up in one week for recheck. Return precautions discussed and provided. Patient agreeable to plan with no unaddressed concerns. Patient discharged in good condition.  I personally performed the services described in this documentation, which was scribed in my presence. The recorded information has been reviewed and is accurate.   Filed Vitals:   03/30/15 2026 03/30/15 2322  BP: 144/90 112/67  Pulse: 76 51  Temp: 97.6 F (36.4 C) 97.5 F (36.4 C)  TempSrc: Oral Oral  Resp: 16 16  SpO2: 99% 96%      Antony Madura, PA-C 03/31/15 2130  Purvis Sheffield, MD 03/31/15 [phone]

## 2015-03-30 NOTE — ED Notes (Signed)
Pt c/o L sided facial swelling and jaw pain x 3 weeks. Pt has been to dentist and dentist said nothing was wrong with any of her teeth. Pt also saw PCP who thought it might be TMJ. A&Ox4.

## 2015-06-25 ENCOUNTER — Encounter (HOSPITAL_COMMUNITY): Payer: Self-pay

## 2015-06-25 ENCOUNTER — Emergency Department (HOSPITAL_COMMUNITY)
Admission: EM | Admit: 2015-06-25 | Discharge: 2015-06-25 | Disposition: A | Discharge status: Home / Self Care | Payer: Private Health Insurance - Indemnity | Attending: Emergency Medicine | Admitting: Emergency Medicine

## 2015-06-25 DIAGNOSIS — M5431 Sciatica, right side: Secondary | ICD-10-CM

## 2015-06-25 DIAGNOSIS — Z720 Tobacco use: Secondary | ICD-10-CM | POA: Insufficient documentation

## 2015-06-25 MED ORDER — METHOCARBAMOL 500 MG PO TABS: 500.0000 mg | ORAL_TABLET | Freq: Two times a day (BID) | ORAL | Status: DC

## 2015-06-25 MED ORDER — PREDNISONE 20 MG PO TABS: ORAL_TABLET | ORAL | Status: DC

## 2015-06-25 MED ORDER — HYDROCODONE-ACETAMINOPHEN 5-325 MG PO TABS: 2.0000 | ORAL_TABLET | Freq: Four times a day (QID) | ORAL | Status: DC | PRN

## 2015-06-25 NOTE — ED Notes (Signed)
Pt presents with c/o lower back pain on the right side. Pt reports hx of back problems. Pt also c/o right leg pain down to her toes.

## 2015-06-25 NOTE — Discharge Instructions (Signed)
Sciatica with Rehab The sciatic nerve runs from the back down the leg and is responsible for sensation and control of the muscles in the back (posterior) side of the thigh, lower leg, and foot. Sciatica is a condition that is characterized by inflammation of this nerve.  SYMPTOMS   Signs of nerve damage, including numbness and/or weakness along the posterior side of the lower extremity.  Pain in the back of the thigh that may also travel down the leg.  Pain that worsens when sitting for long periods of time.  Occasionally, pain in the back or buttock. CAUSES  Inflammation of the sciatic nerve is the cause of sciatica. The inflammation is due to something irritating the nerve. Common sources of irritation include:  Sitting for long periods of time.  Direct trauma to the nerve.  Arthritis of the spine.  Herniated or ruptured disk.  Slipping of the vertebrae (spondylolisthesis).  Pressure from soft tissues, such as muscles or ligament-like tissue (fascia). RISK INCREASES WITH:  Sports that place pressure or stress on the spine (football or weightlifting).  Poor strength and flexibility.  Failure to warm up properly before activity.  Family history of low back pain or disk disorders.  Previous back injury or surgery.  Poor body mechanics, especially when lifting, or poor posture. PREVENTION   Warm up and stretch properly before activity.  Maintain physical fitness:  Strength, flexibility, and endurance.  Cardiovascular fitness.  Learn and use proper technique, especially with posture and lifting. When possible, have coach correct improper technique.  Avoid activities that place stress on the spine. PROGNOSIS If treated properly, then sciatica usually resolves within 6 weeks. However, occasionally surgery is necessary.  RELATED COMPLICATIONS   Permanent nerve damage, including pain, numbness, tingle, or weakness.  Chronic back pain.  Risks of surgery: infection,  bleeding, nerve damage, or damage to surrounding tissues. TREATMENT Treatment initially involves resting from any activities that aggravate your symptoms. The use of ice and medication may help reduce pain and inflammation. The use of strengthening and stretching exercises may help reduce pain with activity. These exercises may be performed at home or with referral to a therapist. A therapist may recommend further treatments, such as transcutaneous electronic nerve stimulation (TENS) or ultrasound. Your caregiver may recommend corticosteroid injections to help reduce inflammation of the sciatic nerve. If symptoms persist despite non-surgical (conservative) treatment, then surgery may be recommended. MEDICATION  If pain medication is necessary, then nonsteroidal anti-inflammatory medications, such as aspirin and ibuprofen, or other minor pain relievers, such as acetaminophen, are often recommended.  Do not take pain medication for 7 days before surgery.  Prescription pain relievers may be given if deemed necessary by your caregiver. Use only as directed and only as much as you need.  Ointments applied to the skin may be helpful.  Corticosteroid injections may be given by your caregiver. These injections should be reserved for the most serious cases, because they may only be given a certain number of times. HEAT AND COLD  Cold treatment (icing) relieves pain and reduces inflammation. Cold treatment should be applied for 10 to 15 minutes every 2 to 3 hours for inflammation and pain and immediately after any activity that aggravates your symptoms. Use ice packs or massage the area with a piece of ice (ice massage).  Heat treatment may be used prior to performing the stretching and strengthening activities prescribed by your caregiver, physical therapist, or athletic trainer. Use a heat pack or soak the injury in warm water.   SEEK MEDICAL CARE IF:  Treatment seems to offer no benefit, or the condition  worsens.  Any medications produce adverse side effects. EXERCISES  RANGE OF MOTION (ROM) AND STRETCHING EXERCISES - Sciatica Most people with sciatic will find that their symptoms worsen with either excessive bending forward (flexion) or arching at the low back (extension). The exercises which will help resolve your symptoms will focus on the opposite motion. Your physician, physical therapist or athletic trainer will help you determine which exercises will be most helpful to resolve your low back pain. Do not complete any exercises without first consulting with your clinician. Discontinue any exercises which worsen your symptoms until you speak to your clinician. If you have pain, numbness or tingling which travels down into your buttocks, leg or foot, the goal of the therapy is for these symptoms to move closer to your back and eventually resolve. Occasionally, these leg symptoms will get better, but your low back pain may worsen; this is typically an indication of progress in your rehabilitation. Be certain to be very alert to any changes in your symptoms and the activities in which you participated in the 24 hours prior to the change. Sharing this information with your clinician will allow him/her to most efficiently treat your condition. These exercises may help you when beginning to rehabilitate your injury. Your symptoms may resolve with or without further involvement from your physician, physical therapist or athletic trainer. While completing these exercises, remember:   Restoring tissue flexibility helps normal motion to return to the joints. This allows healthier, less painful movement and activity.  An effective stretch should be held for at least 30 seconds.  A stretch should never be painful. You should only feel a gentle lengthening or release in the stretched tissue. FLEXION RANGE OF MOTION AND STRETCHING EXERCISES: STRETCH - Flexion, Single Knee to Chest   Lie on a firm bed or floor  with both legs extended in front of you.  Keeping one leg in contact with the floor, bring your opposite knee to your chest. Hold your leg in place by either grabbing behind your thigh or at your knee.  Pull until you feel a gentle stretch in your low back. Hold __________ seconds.  Slowly release your grasp and repeat the exercise with the opposite side. Repeat __________ times. Complete this exercise __________ times per day.  STRETCH - Flexion, Double Knee to Chest  Lie on a firm bed or floor with both legs extended in front of you.  Keeping one leg in contact with the floor, bring your opposite knee to your chest.  Tense your stomach muscles to support your back and then lift your other knee to your chest. Hold your legs in place by either grabbing behind your thighs or at your knees.  Pull both knees toward your chest until you feel a gentle stretch in your low back. Hold __________ seconds.  Tense your stomach muscles and slowly return one leg at a time to the floor. Repeat __________ times. Complete this exercise __________ times per day.  STRETCH - Low Trunk Rotation   Lie on a firm bed or floor. Keeping your legs in front of you, bend your knees so they are both pointed toward the ceiling and your feet are flat on the floor.  Extend your arms out to the side. This will stabilize your upper body by keeping your shoulders in contact with the floor.  Gently and slowly drop both knees together to one side until   you feel a gentle stretch in your low back. Hold for __________ seconds.  Tense your stomach muscles to support your low back as you bring your knees back to the starting position. Repeat the exercise to the other side. Repeat __________ times. Complete this exercise __________ times per day  EXTENSION RANGE OF MOTION AND FLEXIBILITY EXERCISES: STRETCH - Extension, Prone on Elbows  Lie on your stomach on the floor, a bed will be too soft. Place your palms about shoulder  width apart and at the height of your head.  Place your elbows under your shoulders. If this is too painful, stack pillows under your chest.  Allow your body to relax so that your hips drop lower and make contact more completely with the floor.  Hold this position for __________ seconds.  Slowly return to lying flat on the floor. Repeat __________ times. Complete this exercise __________ times per day.  RANGE OF MOTION - Extension, Prone Press Ups  Lie on your stomach on the floor, a bed will be too soft. Place your palms about shoulder width apart and at the height of your head.  Keeping your back as relaxed as possible, slowly straighten your elbows while keeping your hips on the floor. You may adjust the placement of your hands to maximize your comfort. As you gain motion, your hands will come more underneath your shoulders.  Hold this position __________ seconds.  Slowly return to lying flat on the floor. Repeat __________ times. Complete this exercise __________ times per day.  STRENGTHENING EXERCISES - Sciatica  These exercises may help you when beginning to rehabilitate your injury. These exercises should be done near your "sweet spot." This is the neutral, low-back arch, somewhere between fully rounded and fully arched, that is your least painful position. When performed in this safe range of motion, these exercises can be used for people who have either a flexion or extension based injury. These exercises may resolve your symptoms with or without further involvement from your physician, physical therapist or athletic trainer. While completing these exercises, remember:   Muscles can gain both the endurance and the strength needed for everyday activities through controlled exercises.  Complete these exercises as instructed by your physician, physical therapist or athletic trainer. Progress with the resistance and repetition exercises only as your caregiver advises.  You may  experience muscle soreness or fatigue, but the pain or discomfort you are trying to eliminate should never worsen during these exercises. If this pain does worsen, stop and make certain you are following the directions exactly. If the pain is still present after adjustments, discontinue the exercise until you can discuss the trouble with your clinician. STRENGTHENING - Deep Abdominals, Pelvic Tilt   Lie on a firm bed or floor. Keeping your legs in front of you, bend your knees so they are both pointed toward the ceiling and your feet are flat on the floor.  Tense your lower abdominal muscles to press your low back into the floor. This motion will rotate your pelvis so that your tail bone is scooping upwards rather than pointing at your feet or into the floor.  With a gentle tension and even breathing, hold this position for __________ seconds. Repeat __________ times. Complete this exercise __________ times per day.  STRENGTHENING - Abdominals, Crunches   Lie on a firm bed or floor. Keeping your legs in front of you, bend your knees so they are both pointed toward the ceiling and your feet are flat on the   floor. Cross your arms over your chest.  Slightly tip your chin down without bending your neck.  Tense your abdominals and slowly lift your trunk high enough to just clear your shoulder blades. Lifting higher can put excessive stress on the low back and does not further strengthen your abdominal muscles.  Control your return to the starting position. Repeat __________ times. Complete this exercise __________ times per day.  STRENGTHENING - Quadruped, Opposite UE/LE Lift  Assume a hands and knees position on a firm surface. Keep your hands under your shoulders and your knees under your hips. You may place padding under your knees for comfort.  Find your neutral spine and gently tense your abdominal muscles so that you can maintain this position. Your shoulders and hips should form a rectangle  that is parallel with the floor and is not twisted.  Keeping your trunk steady, lift your right hand no higher than your shoulder and then your left leg no higher than your hip. Make sure you are not holding your breath. Hold this position __________ seconds.  Continuing to keep your abdominal muscles tense and your back steady, slowly return to your starting position. Repeat with the opposite arm and leg. Repeat __________ times. Complete this exercise __________ times per day.  STRENGTHENING - Abdominals and Quadriceps, Straight Leg Raise   Lie on a firm bed or floor with both legs extended in front of you.  Keeping one leg in contact with the floor, bend the other knee so that your foot can rest flat on the floor.  Find your neutral spine, and tense your abdominal muscles to maintain your spinal position throughout the exercise.  Slowly lift your straight leg off the floor about 6 inches for a count of 15, making sure to not hold your breath.  Still keeping your neutral spine, slowly lower your leg all the way to the floor. Repeat this exercise with each leg __________ times. Complete this exercise __________ times per day. POSTURE AND BODY MECHANICS CONSIDERATIONS - Sciatica Keeping correct posture when sitting, standing or completing your activities will reduce the stress put on different body tissues, allowing injured tissues a chance to heal and limiting painful experiences. The following are general guidelines for improved posture. Your physician or physical therapist will provide you with any instructions specific to your needs. While reading these guidelines, remember:  The exercises prescribed by your provider will help you have the flexibility and strength to maintain correct postures.  The correct posture provides the optimal environment for your joints to work. All of your joints have less wear and tear when properly supported by a spine with good posture. This means you will  experience a healthier, less painful body.  Correct posture must be practiced with all of your activities, especially prolonged sitting and standing. Correct posture is as important when doing repetitive low-stress activities (typing) as it is when doing a single heavy-load activity (lifting). RESTING POSITIONS Consider which positions are most painful for you when choosing a resting position. If you have pain with flexion-based activities (sitting, bending, stooping, squatting), choose a position that allows you to rest in a less flexed posture. You would want to avoid curling into a fetal position on your side. If your pain worsens with extension-based activities (prolonged standing, working overhead), avoid resting in an extended position such as sleeping on your stomach. Most people will find more comfort when they rest with their spine in a more neutral position, neither too rounded nor too   arched. Lying on a non-sagging bed on your side with a pillow between your knees, or on your back with a pillow under your knees will often provide some relief. Keep in mind, being in any one position for a prolonged period of time, no matter how correct your posture, can still lead to stiffness. PROPER SITTING POSTURE In order to minimize stress and discomfort on your spine, you must sit with correct posture Sitting with good posture should be effortless for a healthy body. Returning to good posture is a gradual process. Many people can work toward this most comfortably by using various supports until they have the flexibility and strength to maintain this posture on their own. When sitting with proper posture, your ears will fall over your shoulders and your shoulders will fall over your hips. You should use the back of the chair to support your upper back. Your low back will be in a neutral position, just slightly arched. You may place a small pillow or folded towel at the base of your low back for support.  When  working at a desk, create an environment that supports good, upright posture. Without extra support, muscles fatigue and lead to excessive strain on joints and other tissues. Keep these recommendations in mind: CHAIR:   A chair should be able to slide under your desk when your back makes contact with the back of the chair. This allows you to work closely.  The chair's height should allow your eyes to be level with the upper part of your monitor and your hands to be slightly lower than your elbows. BODY POSITION  Your feet should make contact with the floor. If this is not possible, use a foot rest.  Keep your ears over your shoulders. This will reduce stress on your neck and low back. INCORRECT SITTING POSTURES   If you are feeling tired and unable to assume a healthy sitting posture, do not slouch or slump. This puts excessive strain on your back tissues, causing more damage and pain. Healthier options include:  Using more support, like a lumbar pillow.  Switching tasks to something that requires you to be upright or walking.  Talking a brief walk.  Lying down to rest in a neutral-spine position. PROLONGED STANDING WHILE SLIGHTLY LEANING FORWARD  When completing a task that requires you to lean forward while standing in one place for a long time, place either foot up on a stationary 2-4 inch high object to help maintain the best posture. When both feet are on the ground, the low back tends to lose its slight inward curve. If this curve flattens (or becomes too large), then the back and your other joints will experience too much stress, fatigue more quickly and can cause pain.  CORRECT STANDING POSTURES Proper standing posture should be assumed with all daily activities, even if they only take a few moments, like when brushing your teeth. As in sitting, your ears should fall over your shoulders and your shoulders should fall over your hips. You should keep a slight tension in your abdominal  muscles to brace your spine. Your tailbone should point down to the ground, not behind your body, resulting in an over-extended swayback posture.  INCORRECT STANDING POSTURES  Common incorrect standing postures include a forward head, locked knees and/or an excessive swayback. WALKING Walk with an upright posture. Your ears, shoulders and hips should all line-up. PROLONGED ACTIVITY IN A FLEXED POSITION When completing a task that requires you to bend forward   at your waist or lean over a low surface, try to find a way to stabilize 3 of 4 of your limbs. You can place a hand or elbow on your thigh or rest a knee on the surface you are reaching across. This will provide you more stability so that your muscles do not fatigue as quickly. By keeping your knees relaxed, or slightly bent, you will also reduce stress across your low back. CORRECT LIFTING TECHNIQUES DO :   Assume a wide stance. This will provide you more stability and the opportunity to get as close as possible to the object which you are lifting.  Tense your abdominals to brace your spine; then bend at the knees and hips. Keeping your back locked in a neutral-spine position, lift using your leg muscles. Lift with your legs, keeping your back straight.  Test the weight of unknown objects before attempting to lift them.  Try to keep your elbows locked down at your sides in order get the best strength from your shoulders when carrying an object.  Always ask for help when lifting heavy or awkward objects. INCORRECT LIFTING TECHNIQUES DO NOT:   Lock your knees when lifting, even if it is a small object.  Bend and twist. Pivot at your feet or move your feet when needing to change directions.  Assume that you cannot safely pick up a paperclip without proper posture. Document Released: 12/11/2005 Document Revised: 04/27/2014 Document Reviewed: 03/25/2009 [employer] Patient Information 2015 [employer]. This information is not intended to  replace advice given to you by your health care provider. Make sure you discuss any questions you have with your health care provider.  

## 2015-06-25 NOTE — ED Provider Notes (Signed)
History  This chart was scribed for non-physician practitioner, Fayrene Helper, PA-C,working with Rolland Porter, MD, by Karle Plumber, ED Scribe. This patient was seen in room WTR5/WTR5 and the patient's care was started at 6:14 PM.  Chief Complaint  Patient presents with  . Back Pain  . Leg Pain   The history is provided by the patient and medical records. No language interpreter was used.    HPI Comments:  Wendy Lynn is a 49 y.o. female who presents to the Emergency Department complaining of severe, sharp, throbbing lower back pain that began approximately two weeks ago. She reports the pain radiates down the right leg all the way into her foot. She reports h/o a bulging disc and has received an MRI to verify this. She reports taking Gabapentin in the past with relief of the pain. She states she no longer has insurance due to changing jobs and has no pain medication. Pt reports taking Ibuprofen and applied iced and heat to the area with no significant relief of the pain. She denies any modifying factors of the pain. She denies any fever, chills, bowel or bladder incontinence, saddle anesthesia, numbness, tingling or weakness of the lower extremities. She states she has been seen by a neurologist but has not seen a neurosurgeon.  Past Medical History  Diagnosis Date  . Ulcer    Past Surgical History  Procedure Laterality Date  . Abdominal hysterectomy     No family history on file. History  Substance Use Topics  . Smoking status: Current Every Day Smoker -- 1.00 packs/day  . Smokeless tobacco: Never Used  . Alcohol Use: No   OB History    No data available     Review of Systems  Constitutional: Negative for fever and chills.  Genitourinary:       No bowel or bladder incontinence No saddle anesthesia  Musculoskeletal: Positive for myalgias and back pain.  Skin: Negative for color change and wound.  Neurological: Negative for weakness and numbness.    Allergies  Review of  patient's allergies indicates no known allergies.  Home Medications   Prior to Admission medications   Medication Sig Start Date End Date Taking? Authorizing Provider  ibuprofen (ADVIL,MOTRIN) 200 MG tablet Take 600 mg by mouth every 6 (six) hours as needed for moderate pain.    Yes Historical Provider, MD  amoxicillin-clavulanate (AUGMENTIN) 875-125 MG per tablet Take 1 tablet by mouth every 12 (twelve) hours. Patient not taking: Reported on 06/25/2015 03/30/15   Antony Madura, PA-C  loratadine (CLARITIN) 10 MG tablet Take 1 tablet (10 mg total) by mouth daily. Patient not taking: Reported on 06/25/2015 03/30/15   Antony Madura, PA-C  meclizine (ANTIVERT) 25 MG tablet Take 1 tablet (25 mg total) by mouth 3 (three) times daily as needed for dizziness. Patient not taking: Reported on 03/30/2015 01/21/14   Cristobal Goldmann, PA-C  naproxen (NAPROSYN) 500 MG tablet Take 1 tablet (500 mg total) by mouth 2 (two) times daily. Patient not taking: Reported on 06/25/2015 03/30/15   Antony Madura, PA-C  sodium chloride (OCEAN) 0.65 % SOLN nasal spray Place 1 spray into both nostrils as needed for congestion. Patient not taking: Reported on 06/25/2015 03/30/15   Antony Madura, PA-C   Triage Vitals: BP 128/71 mmHg  Pulse 71  Temp(Src) 98.1 F (36.7 C) (Oral)  Resp 17  SpO2 98% Physical Exam  Constitutional: She is oriented to person, place, and time. She appears well-developed and well-nourished.  HENT:  Head: Normocephalic and  atraumatic.  Eyes: EOM are normal.  Neck: Normal range of motion.  Cardiovascular: Normal rate.   Pulmonary/Chest: Effort normal.  Musculoskeletal: Normal range of motion. She exhibits tenderness.  Lumbar and paraspinal tenderness to palpation. Tenderness to palpation to lumbosacral region. Positive SLR of RLE. No foot drop.   Neurological: She is alert and oriented to person, place, and time. She has normal reflexes. She displays normal reflexes.  Decreased strength and ROM secondary to pain in RLE  as opposed to the left. Mild antalgic gait favoring left side.   Skin: Skin is warm and dry.  Psychiatric: She has a normal mood and affect. Her behavior is normal.  Nursing note and vitals reviewed.   ED Course  Procedures (including critical care time) DIAGNOSTIC STUDIES: Oxygen Saturation is 98% on RA, normal by my interpretation.   COORDINATION OF CARE: 6:21 PM- patient with history of degenerative disc disease here with radicular back pain consistence with sciatica. No red flags. Will refer to neurosurgeon. Will prescribe steroids and pain medication. Pt verbalizes understanding and agrees to plan.  Medications - No data to display  Labs Review Labs Reviewed - No data to display  Imaging Review No results found.   EKG Interpretation None      MDM   Final diagnoses:  Sciatica, right    BP 128/71 mmHg  Pulse 71  Temp(Src) 98.1 F (36.7 C) (Oral)  Resp 17  SpO2 98%   I personally performed the services described in this documentation, which was scribed in my presence. The recorded information has been reviewed and is accurate.    Fayrene HelperBowie Zakariye Nee, PA-C 06/25/15 1837  Rolland PorterMark James, MD 06/28/15 2136

## 2016-08-07 ENCOUNTER — Emergency Department (HOSPITAL_COMMUNITY)
Admission: EM | Admit: 2016-08-07 | Discharge: 2016-08-07 | Disposition: A | Discharge status: Home / Self Care | Payer: Private Health Insurance - Indemnity | Attending: Emergency Medicine | Admitting: Emergency Medicine

## 2016-08-07 ENCOUNTER — Encounter (HOSPITAL_COMMUNITY): Payer: Self-pay | Admitting: Emergency Medicine

## 2016-08-07 DIAGNOSIS — F17210 Nicotine dependence, cigarettes, uncomplicated: Secondary | ICD-10-CM | POA: Insufficient documentation

## 2016-08-07 DIAGNOSIS — L0201 Cutaneous abscess of face: Secondary | ICD-10-CM | POA: Insufficient documentation

## 2016-08-07 MED ORDER — CEPHALEXIN 500 MG PO CAPS: 500.0000 mg | ORAL_CAPSULE | Freq: Four times a day (QID) | ORAL | 0 refills | Status: DC

## 2016-08-07 MED ORDER — SULFAMETHOXAZOLE-TRIMETHOPRIM 800-160 MG PO TABS: 1.0000 | ORAL_TABLET | Freq: Two times a day (BID) | ORAL | 0 refills | Status: AC

## 2016-08-07 NOTE — ED Triage Notes (Signed)
Pt complaint of right facial abscess worsening since Tuesday.

## 2016-08-07 NOTE — ED Notes (Signed)
Bed: WA30 Expected date:  Expected time:  Means of arrival:  Comments: 

## 2016-08-07 NOTE — ED Provider Notes (Signed)
[facility]-EMERGENCY DEPT Provider Note   CSN: [id number] Arrival date & time: 08/07/16  1612  By signing my name below, I, Soijett Blue, attest that this documentation has been prepared under the direction and in the presence of Langston MaskerKaren Cyril Railey, PA-C Electronically Signed: Soijett Blue, ED Scribe. 08/07/16. 6:21 PM.    History   Chief Complaint Chief Complaint  Patient presents with  . Abscess    HPI Wendy Lynn is a 50 y.o. female who presents to the Emergency Department complaining of gradually worsening abscess to right cheek onset 6 days. Pt notes that she pierced the area with a needle and there was bloody drainage. Pt is having associated symptoms of mild right jaw swelling. She notes that she has tried warm compresses/soaks without medications for the relief of her symptoms. She denies fever, chills, drainage, and any other symptoms. Denies allergies to medications.   The history is provided by the patient. No language interpreter was used.    Past Medical History:  Diagnosis Date  . Ulcer     There are no active problems to display for this patient.   Past Surgical History:  Procedure Laterality Date  . ABDOMINAL HYSTERECTOMY    . BACK SURGERY      OB History    No data available       Home Medications    Prior to Admission medications   Medication Sig Start Date End Date Taking? Authorizing Provider  amoxicillin-clavulanate (AUGMENTIN) 875-125 MG per tablet Take 1 tablet by mouth every 12 (twelve) hours. Patient not taking: Reported on 06/25/2015 03/30/15   Antony MaduraKelly Humes, PA-C  HYDROcodone-acetaminophen (NORCO/VICODIN) 5-325 MG per tablet Take 2 tablets by mouth every 6 (six) hours as needed for severe pain. 06/25/15   Fayrene HelperBowie Tran, PA-C  ibuprofen (ADVIL,MOTRIN) 200 MG tablet Take 600 mg by mouth every 6 (six) hours as needed for moderate pain.     Historical Provider, MD  loratadine (CLARITIN) 10 MG tablet Take 1 tablet (10 mg total) by mouth daily. Patient not taking:  Reported on 06/25/2015 03/30/15   Antony MaduraKelly Humes, PA-C  meclizine (ANTIVERT) 25 MG tablet Take 1 tablet (25 mg total) by mouth 3 (three) times daily as needed for dizziness. Patient not taking: Reported on 03/30/2015 01/21/14   Cristobal GoldmannJacob Lackey, PA-C  methocarbamol (ROBAXIN) 500 MG tablet Take 1 tablet (500 mg total) by mouth 2 (two) times daily. 06/25/15   Fayrene HelperBowie Tran, PA-C  naproxen (NAPROSYN) 500 MG tablet Take 1 tablet (500 mg total) by mouth 2 (two) times daily. Patient not taking: Reported on 06/25/2015 03/30/15   Antony MaduraKelly Humes, PA-C  predniSONE (DELTASONE) 20 MG tablet 3 tabs po day one, then 2 tabs daily x 4 days 06/25/15   Fayrene HelperBowie Tran, PA-C  sodium chloride (OCEAN) 0.65 % SOLN nasal spray Place 1 spray into both nostrils as needed for congestion. Patient not taking: Reported on 06/25/2015 03/30/15   Antony MaduraKelly Humes, PA-C    Family History No family history on file.  Social History Social History  Substance Use Topics  . Smoking status: Current Every Day Smoker    Packs/day: 0.50    Types: Cigarettes  . Smokeless tobacco: Never Used  . Alcohol use No     Allergies   Review of patient's allergies indicates no known allergies.   Review of Systems Review of Systems  HENT:       +Mild right jaw swelling  Skin: Negative for color change and wound.       Abscess to  right cheek.     Physical Exam Updated Vital Signs BP 146/87 (BP Location: Left Arm)   Pulse 78   Temp 98.9 F (37.2 C) (Oral)   Resp 18   Ht 5\' 2"  (1.575 m)   Wt 219 lb (99.3 kg)   SpO2 99%   BMI 40.06 kg/m   Physical Exam  Constitutional: She is oriented to person, place, and time. She appears well-developed and well-nourished. No distress.  HENT:  Head: Normocephalic and atraumatic.  Eyes: EOM are normal.  Neck: Neck supple.  Cardiovascular: Normal rate.   Pulmonary/Chest: Effort normal. No respiratory distress.  Abdominal: She exhibits no distension.  Musculoskeletal: Normal range of motion.  Neurological: She is alert and  oriented to person, place, and time.  Skin: Skin is warm and dry. There is erythema.  2.5 cm round, red, swollen area to right cheek. Dry scaly skin. Skin is flaking. Area is without fluctuance.   Psychiatric: She has a normal mood and affect. Her behavior is normal.  Nursing note and vitals reviewed.    ED Treatments / Results  DIAGNOSTIC STUDIES: Oxygen Saturation is 99% on RA, nl by my interpretation.    COORDINATION OF CARE: 6:14 PM Discussed treatment plan with pt at bedside which includes keflex Rx, bactrim Rx, and pt agreed to plan.   Procedures Procedures (including critical care time)  Medications Ordered in ED Medications - No data to display   Initial Impression / Assessment and Plan / ED Course  I have reviewed the triage vital signs and the nursing notes.    Clinical Course      Patient with skin abscess. I&D not warranted today due to no fluctuance to the area. Supportive care and return precautions discussed.  Pt sent home with keflex RX and bactrim Rx. The patient appears reasonably screened and/or stabilized for discharge and I doubt any other emergent medical condition requiring further screening, evaluation, or treatment in the ED prior to discharge.    Final Clinical Impressions(s) / ED Diagnoses   Final diagnoses:  Abscess of face    New Prescriptions Discharge Medication List as of 08/07/2016  6:25 PM    START taking these medications   Details  cephALEXin (KEFLEX) 500 MG capsule Take 1 capsule (500 mg total) by mouth 4 (four) times daily., Starting Mon 08/07/2016, Print    sulfamethoxazole-trimethoprim (BACTRIM DS,SEPTRA DS) 800-160 MG tablet Take 1 tablet by mouth 2 (two) times daily., Starting Mon 08/07/2016, Until Mon 08/14/2016, Print       Continue warm compresses Antibiotics Return if symptoms worsen or change    Elson AreasLeslie K Lorrinda Ramstad, PA-C 08/07/16 2132    Nira ConnPedro Eduardo Cardama, MD 08/09/16 [phone]

## 2017-07-22 ENCOUNTER — Emergency Department (HOSPITAL_COMMUNITY)
Admission: EM | Admit: 2017-07-22 | Discharge: 2017-07-22 | Disposition: A | Discharge status: Home / Self Care | Payer: Private Health Insurance - Indemnity | Attending: Emergency Medicine | Admitting: Emergency Medicine

## 2017-07-22 ENCOUNTER — Encounter (HOSPITAL_COMMUNITY): Payer: Self-pay | Admitting: Emergency Medicine

## 2017-07-22 DIAGNOSIS — F17210 Nicotine dependence, cigarettes, uncomplicated: Secondary | ICD-10-CM | POA: Insufficient documentation

## 2017-07-22 DIAGNOSIS — H9202 Otalgia, left ear: Secondary | ICD-10-CM | POA: Insufficient documentation

## 2017-07-22 DIAGNOSIS — J0100 Acute maxillary sinusitis, unspecified: Secondary | ICD-10-CM | POA: Insufficient documentation

## 2017-07-22 MED ORDER — AMOXICILLIN-POT CLAVULANATE 875-125 MG PO TABS: 1.0000 | ORAL_TABLET | Freq: Two times a day (BID) | ORAL | 0 refills | Status: DC

## 2017-07-22 MED ORDER — DEXAMETHASONE SODIUM PHOSPHATE 10 MG/ML IJ SOLN
10.0000 mg | Freq: Once | INTRAMUSCULAR | Status: AC
  Administered 2017-07-22: 10 mg via INTRAMUSCULAR
  Filled 2017-07-22: qty 1

## 2017-07-22 NOTE — ED Triage Notes (Signed)
Pt reports pain in l/ear x 4 weeks. Denies drainage. Treated pain with sinus medication.

## 2017-07-22 NOTE — ED Notes (Signed)
Pt declined d/c vitals. She states "I'm fine."

## 2017-07-22 NOTE — Discharge Instructions (Signed)
Use antibiotics as prescribed. You may take Tylenol or ibuprofen as needed for pain. Continue to use nasal sprays daily. Follow-up with ear nose and throat or your primary care doctor if symptoms are not improving after being on antibiotics for 3 days.  Return to the emergency department if you develop fever, chills, worsening pain, sudden hearing loss, or any new or worsening symptoms.

## 2017-07-22 NOTE — ED Provider Notes (Signed)
[facility]ER[facility] DEPT Provider Note   CSN: [id number] Arrival date & time: 07/22/17  1423     History   Chief Complaint Chief Complaint  Patient presents with  . Otalgia    HPI Wendy Lynn is a 51 y.o. female presenting with 3 week history of left ear pain.  Patient states she was traveling by car when she felt very years needed a pop. The right one popped without issues, but the left one would not pop. Since then, she's had persistent left ear pain. She denies discharge from the ear. Pain is worse with swallowing. She has been trying OTC sinus medicines such as Flonase and allergy medicine with minimal relief. She reports her temperature was 99.4 the other day. She's been taking Tylenol and ibuprofen as needed for pain. Yesterday, patient developed worsening frontal facial pressure. She reports a frequent history of sinusitis. She denies headaches, sore throat, difficulty breathing, chest pain, shortness of breath, nausea, vomiting, or abdominal pain. She denies any symptoms of the right ear currently. She has no sick contacts.   HPI  Past Medical History:  Diagnosis Date  . Ulcer     There are no active problems to display for this patient.   Past Surgical History:  Procedure Laterality Date  . ABDOMINAL HYSTERECTOMY    . BACK SURGERY      OB History    No data available       Home Medications    Prior to Admission medications   Medication Sig Start Date End Date Taking? Authorizing Provider  amoxicillin-clavulanate (AUGMENTIN) 875-125 MG tablet Take 1 tablet by mouth every 12 (twelve) hours. 07/22/17   Lamonica Trueba, PA-C  cephALEXin (KEFLEX) 500 MG capsule Take 1 capsule (500 mg total) by mouth 4 (four) times daily. 08/07/16   Elson AreasSofia, Leslie K, PA-C  HYDROcodone-acetaminophen (NORCO/VICODIN) 5-325 MG per tablet Take 2 tablets by mouth every 6 (six) hours as needed for severe pain. 06/25/15   Fayrene Helperran, Bowie, PA-C  ibuprofen (ADVIL,MOTRIN) 200 MG tablet Take 600 mg by  mouth every 6 (six) hours as needed for moderate pain.     [provider]  loratadine (CLARITIN) 10 MG tablet Take 1 tablet (10 mg total) by mouth daily. Patient not taking: Reported on 06/25/2015 03/30/15   Antony MaduraHumes, Kelly, PA-C  meclizine (ANTIVERT) 25 MG tablet Take 1 tablet (25 mg total) by mouth 3 (three) times daily as needed for dizziness. Patient not taking: Reported on 03/30/2015 01/21/14   Cristobal GoldmannLackey, Jacob, PA-C  methocarbamol (ROBAXIN) 500 MG tablet Take 1 tablet (500 mg total) by mouth 2 (two) times daily. 06/25/15   Fayrene Helperran, Bowie, PA-C  naproxen (NAPROSYN) 500 MG tablet Take 1 tablet (500 mg total) by mouth 2 (two) times daily. Patient not taking: Reported on 06/25/2015 03/30/15   Antony MaduraHumes, Kelly, PA-C  predniSONE (DELTASONE) 20 MG tablet 3 tabs po day one, then 2 tabs daily x 4 days 06/25/15   Fayrene Helperran, Bowie, PA-C  sodium chloride (OCEAN) 0.65 % SOLN nasal spray Place 1 spray into both nostrils as needed for congestion. Patient not taking: Reported on 06/25/2015 03/30/15   Antony MaduraHumes, Kelly, PA-C    Family History Family History  Problem Relation Age of Onset  . Heart failure Father     Social History Social History  Substance Use Topics  . Smoking status: Current Every Day Smoker    Packs/day: 0.50    Types: Cigarettes  . Smokeless tobacco: Never Used  . Alcohol use No  Allergies   Patient has no known allergies.   Review of Systems Review of Systems  Constitutional: Negative for chills and fatigue.  HENT: Positive for ear pain and sinus pressure. Negative for congestion, dental problem, postnasal drip, rhinorrhea, sore throat, tinnitus and trouble swallowing.   Eyes: Negative for pain, discharge and itching.     Physical Exam Updated Vital Signs BP 122/84 (BP Location: Right Arm)   Pulse 66   Temp 98.3 F (36.8 C) (Oral)   Resp 20   Wt 99.3 kg (219 lb)   SpO2 96%   BMI 40.06 kg/m   Physical Exam  Constitutional: She is oriented to person, place, and time. She appears  well-developed and well-nourished. No distress.  HENT:  Head: Normocephalic and atraumatic.  Right Ear: External ear and ear canal normal.  Left Ear: External ear and ear canal normal.  Nose: Mucosal edema present. Right sinus exhibits maxillary sinus tenderness. Right sinus exhibits no frontal sinus tenderness. Left sinus exhibits maxillary sinus tenderness. Left sinus exhibits no frontal sinus tenderness.  Mouth/Throat: Uvula is midline, oropharynx is clear and moist and mucous membranes are normal.  Cloudiness of fluid behind left TM. TM is nonerythematous and not bulging. Clear serous fluid behind right TM, also without bulging or erythema. No perforation in either TM. OP clear without erythema or exudate. Tenderness to palpation of maxillary sinuses  Eyes: Pupils are equal, round, and reactive to light. Conjunctivae and EOM are normal.  Neck: Normal range of motion.  Cardiovascular: Normal rate, regular rhythm and intact distal pulses.   Pulmonary/Chest: Effort normal and breath sounds normal. No respiratory distress. She has no wheezes.  Abdominal: Soft. She exhibits no distension. There is no tenderness.  Musculoskeletal: Normal range of motion.  Lymphadenopathy:    She has no cervical adenopathy.  Neurological: She is alert and oriented to person, place, and time.  Skin: Skin is warm and dry. She is not diaphoretic.  Psychiatric: She has a normal mood and affect.  Nursing note and vitals reviewed.    ED Treatments / Results  Labs (all labs ordered are listed, but only abnormal results are displayed) Labs Reviewed - No data to display  EKG  EKG Interpretation None       Radiology No results found.  Procedures Procedures (including critical care time)  Medications Ordered in ED Medications  dexamethasone (DECADRON) injection 10 mg (10 mg Intramuscular Given 07/22/17 1550)     Initial Impression / Assessment and Plan / ED Course  I have reviewed the triage vital  signs and the nursing notes.  Pertinent labs & imaging results that were available during my care of the patient were reviewed by me and considered in my medical decision making (see chart for details).     Patient presenting with 3 week history of left ear pain. Over-the-counter sinus medicines helped mildly. Yesterday patient developed worsening sinus pressure. Exam shows slight cloudiness of the fluid behind the TM of the left ear and tenderness to palpation of the sinuses. Patient with likely sinusitis and eustachian tube dysfunction. Pressures worsening of symptoms extended history of ear pain, will prescribe abx. Patient to continue using Flonase and allergy medicine. Follow-up with primary care if symptoms are not improving. Patient appears safe for discharge at this time. Return precautions given. Patient states she understands and agrees to plan.  Final Clinical Impressions(s) / ED Diagnoses   Final diagnoses:  Otalgia of left ear  Acute maxillary sinusitis, recurrence not specified  New Prescriptions Discharge Medication List as of 07/22/2017  3:35 PM       Alveria Apley, PA-C 07/22/17 1707    Cardama, Amadeo Garnet, MD 07/22/17 [phone]

## 2018-04-17 ENCOUNTER — Other Ambulatory Visit: Payer: Self-pay | Admitting: Neurosurgery

## 2018-04-17 DIAGNOSIS — M5416 Radiculopathy, lumbar region: Secondary | ICD-10-CM

## 2018-05-10 ENCOUNTER — Ambulatory Visit
Admission: RE | Admit: 2018-05-10 | Discharge: 2018-05-10 | Disposition: A | Discharge status: Home / Self Care | Payer: Private Health Insurance - Indemnity | Source: Ambulatory Visit | Attending: Neurosurgery | Admitting: Neurosurgery

## 2018-05-10 ENCOUNTER — Ambulatory Visit
Admission: RE | Admit: 2018-05-10 | Discharge: 2018-05-10 | Disposition: A | Discharge status: Home / Self Care | Payer: BLUE CROSS/BLUE SHIELD | Source: Ambulatory Visit | Attending: Neurosurgery | Admitting: Neurosurgery

## 2018-05-10 DIAGNOSIS — M5416 Radiculopathy, lumbar region: Secondary | ICD-10-CM

## 2018-05-10 MED ORDER — MEPERIDINE HCL 100 MG/ML IJ SOLN
75.0000 mg | Freq: Once | INTRAMUSCULAR | Status: AC
  Administered 2018-05-10: 75 mg via INTRAMUSCULAR

## 2018-05-10 MED ORDER — DIAZEPAM 5 MG PO TABS
10.0000 mg | ORAL_TABLET | Freq: Once | ORAL | Status: AC
  Administered 2018-05-10: 10 mg via ORAL

## 2018-05-10 MED ORDER — IOPAMIDOL (ISOVUE-M 200) INJECTION 41%
15.0000 mL | Freq: Once | INTRAMUSCULAR | Status: AC
  Administered 2018-05-10: 15 mL via INTRATHECAL

## 2018-05-10 MED ORDER — ONDANSETRON HCL 4 MG/2ML IJ SOLN: 4.0000 mg | Freq: Four times a day (QID) | INTRAMUSCULAR | Status: DC | PRN

## 2018-05-10 MED ORDER — ONDANSETRON HCL 4 MG/2ML IJ SOLN
4.0000 mg | Freq: Once | INTRAMUSCULAR | Status: AC
  Administered 2018-05-10: 4 mg via INTRAMUSCULAR

## 2018-05-10 NOTE — Discharge Instructions (Signed)

## 2018-05-17 ENCOUNTER — Other Ambulatory Visit: Payer: Private Health Insurance - Indemnity

## 2018-05-23 ENCOUNTER — Other Ambulatory Visit: Payer: Self-pay | Admitting: Neurosurgery

## 2018-08-16 NOTE — Pre-Procedure Instructions (Signed)
Shelba FlakeDebra Hupp  08/16/2018      [id number] - [place], [place] - [address] [place] [id number] Phone: [phone] Fax: [phone]    Your procedure is scheduled on Tuesday September 3.  [facility].M.  Call this number if you have problems the morning of surgery:  [phone]   Remember:  Do not eat or drink after midnight.    Take these medicines the morning of surgery with A SIP OF WATER: cetirizine (Zyrtec) if needed, hydrocodone (Norco) if needed  7 days prior to surgery STOP taking any Aspirin(unless otherwise instructed by your surgeon), Aleve, Naproxen, Ibuprofen, Motrin, Advil, Goody's, BC's, all herbal medications, fish oil, and all vitamins     Do not wear jewelry, make-up or nail polish.  Do not wear lotions, powders, or perfumes, or deodorant.  Do not shave 48 hours prior to surgery.  Men may shave face and neck.  Do not bring valuables to the hospital.  [facility] is not [facility] for any belongings or valuables.  Contacts, dentures or bridgework may not be worn into surgery.  Leave your suitcase in the car.  After surgery it may be brought to your room.  For patients admitted to the hospital, discharge time will be determined by your treatment team.  Patients discharged the day of surgery will not be allowed to drive home.   Special instructions:    [place]- Preparing For Surgery  Before surgery, you can play an important role. Because skin is not sterile, your skin needs to be as free of germs as possible. You can reduce the number of germs on your skin by washing with CHG (chlorahexidine gluconate) Soap before surgery.  CHG is an antiseptic cleaner which kills germs and bonds with the skin to continue killing germs even after washing.    Oral Hygiene is also important to reduce your risk of infection.  Remember - BRUSH YOUR  TEETH THE MORNING OF SURGERY WITH YOUR REGULAR TOOTHPASTE  Please do not use if you have an allergy to CHG or antibacterial soaps. If your skin becomes reddened/irritated stop using the CHG.  Do not shave (including legs and underarms) for at least 48 hours prior to first CHG shower. It is OK to shave your face.  Please follow these instructions carefully.   1. Shower the NIGHT BEFORE SURGERY and the MORNING OF SURGERY with CHG.   2. If you chose to wash your hair, wash your hair first as usual with your normal shampoo.  3. After you shampoo, rinse your hair and body thoroughly to remove the shampoo.  4. Use CHG as you would any other liquid soap. You can apply CHG directly to the skin and wash gently with a scrungie or a clean washcloth.   5. Apply the CHG Soap to your body ONLY FROM THE NECK DOWN.  Do not use on open wounds or open sores. Avoid contact with your eyes, ears, mouth and genitals (private parts). Wash Face and genitals (private parts)  with your normal soap.  6. Wash thoroughly, paying special attention to the area where your surgery will be performed.  7. Thoroughly rinse your body with warm water from the neck down.  8. DO NOT shower/wash with your normal soap after using and rinsing off the CHG Soap.  9. Pat yourself dry with a CLEAN  TOWEL.  10. Wear CLEAN PAJAMAS to bed the night before surgery, wear comfortable clothes the morning of surgery  11. Place CLEAN SHEETS on your bed the night of your first shower and DO NOT SLEEP WITH PETS.    Day of Surgery:  Do not apply any deodorants/lotions.  Please wear clean clothes to the hospital/surgery center.   Remember to brush your teeth WITH YOUR REGULAR TOOTHPASTE.    Please read over the following fact sheets that you were given. Coughing and Deep Breathing, MRSA Information and Surgical Site Infection Prevention

## 2018-08-19 ENCOUNTER — Encounter (HOSPITAL_COMMUNITY): Payer: Self-pay

## 2018-08-19 ENCOUNTER — Other Ambulatory Visit: Payer: Self-pay

## 2018-08-19 ENCOUNTER — Encounter (HOSPITAL_COMMUNITY)
Admission: RE | Admit: 2018-08-19 | Discharge: 2018-08-19 | Disposition: A | Discharge status: Home / Self Care | Payer: BLUE CROSS/BLUE SHIELD | Source: Ambulatory Visit | Attending: Neurosurgery | Admitting: Neurosurgery

## 2018-08-19 DIAGNOSIS — Z01818 Encounter for other preprocedural examination: Secondary | ICD-10-CM | POA: Diagnosis present

## 2018-08-19 DIAGNOSIS — M4807 Spinal stenosis, lumbosacral region: Secondary | ICD-10-CM | POA: Diagnosis not present

## 2018-08-19 HISTORY — DX: Other specified postprocedural states: R11.2

## 2018-08-19 HISTORY — DX: Other specified postprocedural states: Z98.890

## 2018-08-19 HISTORY — DX: Unspecified asthma, uncomplicated: J45.909

## 2018-08-19 LAB — SURGICAL PCR SCREEN
MRSA, PCR: NEGATIVE
STAPHYLOCOCCUS AUREUS: POSITIVE — AB

## 2018-08-19 LAB — CBC WITH DIFFERENTIAL/PLATELET
ABS IMMATURE GRANULOCYTES: 0.1 10*3/uL (ref 0.0–0.1)
BASOS ABS: 0.1 10*3/uL (ref 0.0–0.1)
BASOS PCT: 1 %
Eosinophils Absolute: 0.1 10*3/uL (ref 0.0–0.7)
Eosinophils Relative: 1 %
HCT: 44.4 % (ref 36.0–46.0)
HEMOGLOBIN: 14.4 g/dL (ref 12.0–15.0)
IMMATURE GRANULOCYTES: 1 %
LYMPHS PCT: 14 %
Lymphs Abs: 2.7 10*3/uL (ref 0.7–4.0)
MCH: 32.7 pg (ref 26.0–34.0)
MCHC: 32.4 g/dL (ref 30.0–36.0)
MCV: 100.7 fL — ABNORMAL HIGH (ref 78.0–100.0)
Monocytes Absolute: 0.7 10*3/uL (ref 0.1–1.0)
Monocytes Relative: 4 %
NEUTROS ABS: 14.7 10*3/uL — AB (ref 1.7–7.7)
NEUTROS PCT: 79 %
PLATELETS: 264 10*3/uL (ref 150–400)
RBC: 4.41 MIL/uL (ref 3.87–5.11)
RDW: 13.2 % (ref 11.5–15.5)
WBC: 18.4 10*3/uL — AB (ref 4.0–10.5)

## 2018-08-19 LAB — BASIC METABOLIC PANEL
Anion gap: 10 (ref 5–15)
BUN: 7 mg/dL (ref 6–20)
CO2: 24 mmol/L (ref 22–32)
CREATININE: 0.5 mg/dL (ref 0.44–1.00)
Calcium: 9.4 mg/dL (ref 8.9–10.3)
Chloride: 110 mmol/L (ref 98–111)
GFR calc Af Amer: >60 mL/min (ref >60)
GLUCOSE: 97 mg/dL (ref 70–99)
POTASSIUM: 3.9 mmol/L (ref 3.5–5.1)
SODIUM: 144 mmol/L (ref 135–145)

## 2018-08-19 LAB — ABO/RH: ABO/RH(D): O POS

## 2018-08-19 NOTE — Pre-Procedure Instructions (Signed)
Wendy FlakeDebra Lynn  08/19/2018      [id number] - [place], [place] - [address] [place] [id number] Phone: [phone] Fax: [phone]    Your procedure is scheduled on Tuesday September 3rd.   [facility] Ad[facility] at 6:00 A.M.             (posted surgery time 8a - 10:48a)   Call this number if you have problems the morning of surgery:  [phone]   Remember:   Do not eat any foods or drink any liquids after midnight, Monday.    Take these medicines the morning of surgery with A SIP OF WATER: cetirizine (Zyrtec) if needed, hydrocodone (Norco) if needed  7 days prior to surgery STOP taking any Aspirin(unless otherwise instructed by your surgeon), Aleve, Naproxen, Ibuprofen, Motrin, Advil, Goody's, BC's, all herbal medications, fish oil, and all vitamins     Do not wear jewelry, make-up or nail polish.  Do not wear lotions, powders, or perfumes, or deodorant.  Do not shave 48 hours prior to surgery.   Do not bring valuables to the hospital.  [facility] is not responsible for any belongings or valuables.  Contacts, dentures or bridgework may not be worn into surgery.  Leave your suitcase in the car.  After surgery it may be brought to your room.  For patients admitted to the hospital, discharge time will be determined by your treatment team.     Special instructions:    [place]- Preparing For Surgery  Before surgery, you can play an important role. Because skin is not sterile, your skin needs to be as free of germs as possible. You can reduce the number of germs on your skin by washing with CHG (chlorahexidine gluconate) Soap before surgery.  CHG is an antiseptic cleaner which kills germs and bonds with the skin to continue killing germs even after washing.    Oral Hygiene is also important to reduce your risk of infection.    Remember - BRUSH YOUR TEETH THE  MORNING OF SURGERY WITH YOUR REGULAR TOOTHPASTE  Please do not use if you have an allergy to CHG or antibacterial soaps. If your skin becomes reddened/irritated stop using the CHG.  Do not shave (including legs and underarms) for at least 48 hours prior to first CHG shower. It is OK to shave your face.  Please follow these instructions carefully.   1. Shower the NIGHT BEFORE SURGERY and the MORNING OF SURGERY with CHG.   2. If you chose to wash your hair, wash your hair first as usual with your normal shampoo.  3. After you shampoo, rinse your hair and body thoroughly to remove the shampoo.  4. Use CHG as you would any other liquid soap. You can apply CHG directly to the skin and wash gently with a scrungie or a clean washcloth.   5. Apply the CHG Soap to your body ONLY FROM THE NECK DOWN.  Do not use on open wounds or open sores. Avoid contact with your eyes, ears, mouth and genitals (private parts). Wash Face and genitals (private parts)  with your normal soap.  6. Wash thoroughly, paying special attention to the area where your surgery will be performed.  7. Thoroughly rinse your body with warm water from the neck down.  8. DO NOT shower/wash with your normal soap after using and rinsing off the  CHG Soap.  9. Pat yourself dry with a CLEAN TOWEL.  10. Wear CLEAN PAJAMAS to bed the night before surgery, wear comfortable clothes the morning of surgery  11. Place CLEAN SHEETS on your bed the night of your first shower and DO NOT SLEEP WITH PETS.  Day of Surgery:  Do not apply any deodorants/lotions.  Please wear clean clothes to the hospital/surgery center.   Remember to brush your teeth WITH YOUR REGULAR TOOTHPASTE.  Please read over the following fact sheets that you were given. MRSA Information and Surgical Site Infection Prevention

## 2018-08-19 NOTE — Progress Notes (Addendum)
Anesthesia Chart Review:  Case:  [id number] Date/Time:  08/27/18 0745   Procedure:  PLIF - L5-S1 (N/A Back)   Anesthesia type:  General   Pre-op diagnosis:  Stenosis   Location:  [facility] OR ROOM 18 / [facility] OR   Surgeon:  Julio Sicks, MD      DISCUSSION: Patient is a 52 year old female scheduled for the above procedure. History includes smoking, post-operative N/V, childhood asthma. BMI is consistent with obesity.  Patient's labs today showed WBC of 18.4, Neut # 14.7. I called and spoke with patient. She denied cough, fever, chills, rash, diarrhea, dysuria. No recent steroids. 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 (Update: Formal PCP appointment scheduled for 08/21/18. Patient reported that NP said patient had has intermittent leukocytosis in the past.)  Erie Noe at Dr. Lindalou Hose office notified of elevated WBC results and that patient is having PCP follow-up this week. Chart will be left for follow-up PCP recommendations.  ADDENDUM 08/22/18 11:43 AM: Patient was seen on 08/21/18 by Lodema Pilot, PA-C at PCP office. She notes that patient has had intermittent elevations of her WBC count in the past. Appears exam findings were unremarkable. She recommended a repeat CBC and based on results would determine if additional testing or further input from Dr. Leonette Most was warranted. Repeat WBC 12.7. I notified Erie Noe at Dr. Lindalou Hose office of repeat results. Defer to PCP and surgeon if any additional testing indicated prior to surgery.   ADDENDUM 08/23/18 9:37 AM: Patient called to report that PCP added "urinalysis" and chest xray since WBC was still a little elevated (12.7). Patient continues to feel well without any sick symptoms. CXR showed no acute process. In review of Care Everywhere, it looks like urine culture, not urinalysis was ordered. I don't anticipate results of the urine culture until this weekend.  I left a voice message for Erie Noe at Dr. Lindalou Hose office to inform him, so he can follow-up with patient regarding results. From an anesthesia standpoint, I believe she can still proceed with surgery if no acute changes.   VS: BP 137/83   Pulse 63   Temp 36.8 C   Resp 20   Ht 5\' 2"  (1.575 m)   Wt 98 kg   SpO2 98%   BMI 39.51 kg/m   PROVIDERS: Loyal Jacobson, MD is PCP [facility] [place]; see Care Everywhere). She typically sees Laureen Ochs, [place]. (There is a satellite office at her work.)   LABS:  - Repeat CBC 08/22/18 (Care Everywhere): WBC 12.7, RBC 4.20, H/H 14.0/41.5, PLT 268. Neutrophil absolute 7.7, neutrophil % 61%.  - Preoperative labs noted. WBC 18.4. BMET WNL.  (all labs ordered are listed, but only abnormal results are displayed)  Labs Reviewed  SURGICAL PCR SCREEN - Abnormal; Notable for the following components:      Result Value   Staphylococcus aureus POSITIVE (*)    All other components within normal limits  CBC WITH DIFFERENTIAL/PLATELET - Abnormal; Notable for the following components:   WBC 18.4 (*)    MCV 100.7 (*)    Neutro Abs 14.7 (*)    All other components within normal limits  BASIC METABOLIC PANEL  TYPE AND SCREEN     IMAGES: CXR 08/22/18 [facility] Care Everywhere): Result Impression: There is no evidence of acute cardiac or pulmonary abnormality.  CT L-spine 05/10/18: IMPRESSION: 1. Interval postsurgical changes at L5-S1 without recurrent disc herniation  or right lateral recess stenosis. Progressive L5-S1 disc degeneration with new mild right neural foraminal stenosis. 2. Surgical clip in the left lateral recess at L5-S1 deflecting the left S1 nerve root. 3. Unchanged disc degeneration elsewhere resulting in moderate left neural foraminal stenosis at L4-5 and mild-to-moderate left foraminal stenosis at L3-4.   EKG: N/A   CV: N/A   Past Medical History:  Diagnosis Date  . Asthma    AS A CHILD, GREW OUT OF IT AROUND 24 YR OF AGE  .  PONV (postoperative nausea and vomiting)    WITH PREVOUS BACK SURGERY.  Marland Kitchen. Ulcer     Past Surgical History:  Procedure Laterality Date  . ABDOMINAL HYSTERECTOMY    . BACK SURGERY    . ELBOW SURGERY     LEFT     MEDICATIONS: . aspirin-acetaminophen-caffeine (EXCEDRIN MIGRAINE) 250-250-65 MG tablet  . cetirizine (ZYRTEC) 10 MG tablet  . HYDROcodone-acetaminophen (NORCO) 10-325 MG tablet   No current facility-administered medications for this encounter.     Velna Ochsllison Teana Lindahl, PA-C [facility]Anesthesiology Phone [phone] 08/19/2018 1:27 PM

## 2018-08-19 NOTE — Progress Notes (Signed)
PCP is Laureen OchsMegan Hunter, NP  LOV 03/2018 Does have hx of bronchitis Denies murmur, htn, cp, sob.  No cardiac tests done, no visits to cardio.

## 2018-08-20 LAB — TYPE AND SCREEN
ABO/RH(D): O POS
ANTIBODY SCREEN: NEGATIVE

## 2018-08-27 ENCOUNTER — Inpatient Hospital Stay (HOSPITAL_COMMUNITY)
Admission: RE | Admit: 2018-08-27 | Discharge: 2018-08-28 | DRG: 455 | Disposition: A | Discharge status: Home / Self Care | Payer: BLUE CROSS/BLUE SHIELD | Attending: Neurosurgery | Admitting: Neurosurgery

## 2018-08-27 ENCOUNTER — Encounter (HOSPITAL_COMMUNITY): Payer: Self-pay | Admitting: Anesthesiology

## 2018-08-27 ENCOUNTER — Inpatient Hospital Stay (HOSPITAL_COMMUNITY)
Admission: RE | Disposition: A | Discharge status: Home / Self Care | Payer: Self-pay | Source: Ambulatory Visit | Attending: Neurosurgery

## 2018-08-27 ENCOUNTER — Inpatient Hospital Stay (HOSPITAL_COMMUNITY): Payer: BLUE CROSS/BLUE SHIELD

## 2018-08-27 ENCOUNTER — Other Ambulatory Visit: Payer: Self-pay

## 2018-08-27 ENCOUNTER — Inpatient Hospital Stay (HOSPITAL_COMMUNITY): Payer: BLUE CROSS/BLUE SHIELD | Admitting: Physician Assistant

## 2018-08-27 ENCOUNTER — Inpatient Hospital Stay (HOSPITAL_COMMUNITY): Payer: BLUE CROSS/BLUE SHIELD | Admitting: Anesthesiology

## 2018-08-27 DIAGNOSIS — Z90710 Acquired absence of both cervix and uterus: Secondary | ICD-10-CM

## 2018-08-27 DIAGNOSIS — G8929 Other chronic pain: Secondary | ICD-10-CM | POA: Diagnosis present

## 2018-08-27 DIAGNOSIS — M5117 Intervertebral disc disorders with radiculopathy, lumbosacral region: Secondary | ICD-10-CM | POA: Diagnosis present

## 2018-08-27 DIAGNOSIS — M545 Low back pain: Secondary | ICD-10-CM | POA: Diagnosis present

## 2018-08-27 DIAGNOSIS — M48061 Spinal stenosis, lumbar region without neurogenic claudication: Secondary | ICD-10-CM | POA: Diagnosis present

## 2018-08-27 DIAGNOSIS — Z419 Encounter for procedure for purposes other than remedying health state, unspecified: Secondary | ICD-10-CM

## 2018-08-27 DIAGNOSIS — F17210 Nicotine dependence, cigarettes, uncomplicated: Secondary | ICD-10-CM | POA: Diagnosis present

## 2018-08-27 SURGERY — POSTERIOR LUMBAR FUSION 1 LEVEL
Anesthesia: General | Site: Back

## 2018-08-27 MED ORDER — ONDANSETRON HCL 4 MG/2ML IJ SOLN
INTRAMUSCULAR | Status: DC | PRN
  Administered 2018-08-27: 4 mg via INTRAVENOUS

## 2018-08-27 MED ORDER — ACETAMINOPHEN 325 MG PO TABS: 650.0000 mg | ORAL_TABLET | ORAL | Status: DC | PRN

## 2018-08-27 MED ORDER — HYDROMORPHONE HCL 1 MG/ML IJ SOLN
0.2500 mg | INTRAMUSCULAR | Status: DC | PRN
  Administered 2018-08-27 (×3): 0.5 mg via INTRAVENOUS

## 2018-08-27 MED ORDER — ONDANSETRON HCL 4 MG/2ML IJ SOLN: 4.0000 mg | Freq: Four times a day (QID) | INTRAMUSCULAR | Status: DC | PRN

## 2018-08-27 MED ORDER — FENTANYL CITRATE (PF) 250 MCG/5ML IJ SOLN
INTRAMUSCULAR | Status: AC
  Filled 2018-08-27: qty 5

## 2018-08-27 MED ORDER — DIAZEPAM 5 MG PO TABS
ORAL_TABLET | ORAL | Status: AC
  Filled 2018-08-27: qty 1

## 2018-08-27 MED ORDER — SODIUM CHLORIDE 0.9 % IV SOLN
INTRAVENOUS | Status: DC | PRN
  Administered 2018-08-27: 10 ug/min via INTRAVENOUS

## 2018-08-27 MED ORDER — MIDAZOLAM HCL 2 MG/2ML IJ SOLN
INTRAMUSCULAR | Status: AC
  Filled 2018-08-27: qty 2

## 2018-08-27 MED ORDER — CEFAZOLIN SODIUM-DEXTROSE 2-4 GM/100ML-% IV SOLN
INTRAVENOUS | Status: AC
  Filled 2018-08-27: qty 100

## 2018-08-27 MED ORDER — MENTHOL 3 MG MT LOZG: 1.0000 | LOZENGE | OROMUCOSAL | Status: DC | PRN

## 2018-08-27 MED ORDER — ROCURONIUM BROMIDE 50 MG/5ML IV SOSY
PREFILLED_SYRINGE | INTRAVENOUS | Status: DC | PRN
  Administered 2018-08-27: 50 mg via INTRAVENOUS
  Administered 2018-08-27: 10 mg via INTRAVENOUS

## 2018-08-27 MED ORDER — HYDROCODONE-ACETAMINOPHEN 10-325 MG PO TABS: 1.0000 | ORAL_TABLET | ORAL | Status: DC | PRN

## 2018-08-27 MED ORDER — PROPOFOL 10 MG/ML IV BOLUS
INTRAVENOUS | Status: AC
  Filled 2018-08-27: qty 20

## 2018-08-27 MED ORDER — HYDROMORPHONE HCL 1 MG/ML IJ SOLN
INTRAMUSCULAR | Status: AC
  Filled 2018-08-27: qty 1

## 2018-08-27 MED ORDER — BISACODYL 10 MG RE SUPP: 10.0000 mg | Freq: Every day | RECTAL | Status: DC | PRN

## 2018-08-27 MED ORDER — DEXAMETHASONE SODIUM PHOSPHATE 10 MG/ML IJ SOLN
10.0000 mg | INTRAMUSCULAR | Status: AC
  Administered 2018-08-27: 10 mg via INTRAVENOUS

## 2018-08-27 MED ORDER — HYDROMORPHONE HCL 1 MG/ML IJ SOLN
1.0000 mg | INTRAMUSCULAR | Status: DC | PRN
  Administered 2018-08-27: 1 mg via INTRAVENOUS
  Filled 2018-08-27: qty 1

## 2018-08-27 MED ORDER — MIDAZOLAM HCL 5 MG/5ML IJ SOLN
INTRAMUSCULAR | Status: DC | PRN
  Administered 2018-08-27: 2 mg via INTRAVENOUS

## 2018-08-27 MED ORDER — POLYETHYLENE GLYCOL 3350 17 G PO PACK: 17.0000 g | PACK | Freq: Every day | ORAL | Status: DC | PRN

## 2018-08-27 MED ORDER — LORATADINE 10 MG PO TABS
10.0000 mg | ORAL_TABLET | Freq: Every day | ORAL | Status: DC
  Administered 2018-08-27 – 2018-08-28 (×2): 10 mg via ORAL
  Filled 2018-08-27 (×2): qty 1

## 2018-08-27 MED ORDER — DEXAMETHASONE SODIUM PHOSPHATE 10 MG/ML IJ SOLN
INTRAMUSCULAR | Status: AC
  Filled 2018-08-27: qty 1

## 2018-08-27 MED ORDER — PROPOFOL 10 MG/ML IV BOLUS
INTRAVENOUS | Status: DC | PRN
  Administered 2018-08-27: 200 mg via INTRAVENOUS

## 2018-08-27 MED ORDER — FLEET ENEMA 7-19 GM/118ML RE ENEM: 1.0000 | ENEMA | Freq: Once | RECTAL | Status: DC | PRN

## 2018-08-27 MED ORDER — SODIUM CHLORIDE 0.9% FLUSH
3.0000 mL | Freq: Two times a day (BID) | INTRAVENOUS | Status: DC
  Administered 2018-08-27: 3 mL via INTRAVENOUS

## 2018-08-27 MED ORDER — OXYCODONE HCL 5 MG/5ML PO SOLN: 5.0000 mg | Freq: Once | ORAL | Status: DC | PRN

## 2018-08-27 MED ORDER — VANCOMYCIN HCL 1000 MG IV SOLR
INTRAVENOUS | Status: AC
  Filled 2018-08-27: qty 1000

## 2018-08-27 MED ORDER — LACTATED RINGERS IV SOLN: INTRAVENOUS | Status: DC

## 2018-08-27 MED ORDER — LACTATED RINGERS IV SOLN
INTRAVENOUS | Status: DC | PRN
  Administered 2018-08-27 (×2): via INTRAVENOUS

## 2018-08-27 MED ORDER — 0.9 % SODIUM CHLORIDE (POUR BTL) OPTIME
TOPICAL | Status: DC | PRN
  Administered 2018-08-27: 1000 mL

## 2018-08-27 MED ORDER — DIAZEPAM 5 MG PO TABS
5.0000 mg | ORAL_TABLET | Freq: Four times a day (QID) | ORAL | Status: DC | PRN
  Administered 2018-08-27 (×2): 5 mg via ORAL
  Filled 2018-08-27: qty 1

## 2018-08-27 MED ORDER — ONDANSETRON HCL 4 MG PO TABS: 4.0000 mg | ORAL_TABLET | Freq: Four times a day (QID) | ORAL | Status: DC | PRN

## 2018-08-27 MED ORDER — PHENOL 1.4 % MT LIQD: 1.0000 | OROMUCOSAL | Status: DC | PRN

## 2018-08-27 MED ORDER — SUGAMMADEX SODIUM 200 MG/2ML IV SOLN
INTRAVENOUS | Status: DC | PRN
  Administered 2018-08-27: 200 mg via INTRAVENOUS

## 2018-08-27 MED ORDER — PHENYLEPHRINE HCL 10 MG/ML IJ SOLN
INTRAMUSCULAR | Status: DC | PRN
  Administered 2018-08-27 (×4): 40 ug via INTRAVENOUS

## 2018-08-27 MED ORDER — SODIUM CHLORIDE 0.9 % IV SOLN
INTRAVENOUS | Status: DC | PRN
  Administered 2018-08-27: 09:00:00

## 2018-08-27 MED ORDER — OXYCODONE HCL 5 MG PO TABS: 5.0000 mg | ORAL_TABLET | Freq: Once | ORAL | Status: DC | PRN

## 2018-08-27 MED ORDER — BUPIVACAINE HCL (PF) 0.25 % IJ SOLN
INTRAMUSCULAR | Status: DC | PRN
  Administered 2018-08-27: 20 mL

## 2018-08-27 MED ORDER — CEFAZOLIN SODIUM-DEXTROSE 2-4 GM/100ML-% IV SOLN
2.0000 g | INTRAVENOUS | Status: AC
  Administered 2018-08-27: 2 g via INTRAVENOUS

## 2018-08-27 MED ORDER — ASPIRIN-ACETAMINOPHEN-CAFFEINE 250-250-65 MG PO TABS: 2.0000 | ORAL_TABLET | Freq: Every day | ORAL | Status: DC | PRN

## 2018-08-27 MED ORDER — BUPIVACAINE HCL (PF) 0.25 % IJ SOLN
INTRAMUSCULAR | Status: AC
  Filled 2018-08-27: qty 30

## 2018-08-27 MED ORDER — CHLORHEXIDINE GLUCONATE CLOTH 2 % EX PADS: 6.0000 | MEDICATED_PAD | Freq: Once | CUTANEOUS | Status: DC

## 2018-08-27 MED ORDER — LIDOCAINE 2% (20 MG/ML) 5 ML SYRINGE
INTRAMUSCULAR | Status: DC | PRN
  Administered 2018-08-27: 80 mg via INTRAVENOUS

## 2018-08-27 MED ORDER — OXYCODONE HCL 5 MG PO TABS
ORAL_TABLET | ORAL | Status: AC
  Filled 2018-08-27: qty 2

## 2018-08-27 MED ORDER — THROMBIN (RECOMBINANT) 20000 UNITS EX SOLR
CUTANEOUS | Status: AC
  Filled 2018-08-27: qty 20000

## 2018-08-27 MED ORDER — OXYCODONE HCL 5 MG PO TABS
10.0000 mg | ORAL_TABLET | ORAL | Status: DC | PRN
  Administered 2018-08-27 – 2018-08-28 (×7): 10 mg via ORAL
  Filled 2018-08-27 (×6): qty 2

## 2018-08-27 MED ORDER — VANCOMYCIN HCL 1 G IV SOLR
INTRAVENOUS | Status: DC | PRN
  Administered 2018-08-27: 1000 mg via TOPICAL

## 2018-08-27 MED ORDER — FENTANYL CITRATE (PF) 100 MCG/2ML IJ SOLN
INTRAMUSCULAR | Status: DC | PRN
  Administered 2018-08-27 (×2): 50 ug via INTRAVENOUS
  Administered 2018-08-27: 150 ug via INTRAVENOUS

## 2018-08-27 MED ORDER — ACETAMINOPHEN 650 MG RE SUPP: 650.0000 mg | RECTAL | Status: DC | PRN

## 2018-08-27 MED ORDER — THROMBIN 20000 UNITS EX SOLR
CUTANEOUS | Status: DC | PRN
  Administered 2018-08-27: 09:00:00 via TOPICAL

## 2018-08-27 MED ORDER — SUGAMMADEX SODIUM 500 MG/5ML IV SOLN
INTRAVENOUS | Status: AC
  Filled 2018-08-27: qty 5

## 2018-08-27 MED ORDER — CEFAZOLIN SODIUM-DEXTROSE 1-4 GM/50ML-% IV SOLN
1.0000 g | Freq: Three times a day (TID) | INTRAVENOUS | Status: AC
  Administered 2018-08-27 (×2): 1 g via INTRAVENOUS
  Filled 2018-08-27 (×2): qty 50

## 2018-08-27 MED ORDER — SODIUM CHLORIDE 0.9% FLUSH: 3.0000 mL | INTRAVENOUS | Status: DC | PRN

## 2018-08-27 MED ORDER — EPHEDRINE SULFATE 50 MG/ML IJ SOLN
INTRAMUSCULAR | Status: DC | PRN
  Administered 2018-08-27: 5 mg via INTRAVENOUS

## 2018-08-27 SURGICAL SUPPLY — 70 items
BAG DECANTER FOR FLEXI CONT (MISCELLANEOUS) ×3 IMPLANT
BENZOIN TINCTURE PRP APPL 2/3 (GAUZE/BANDAGES/DRESSINGS) ×3 IMPLANT
BLADE CLIPPER SURG (BLADE) IMPLANT
BUR CUTTER 7.0 ROUND (BURR) ×3 IMPLANT
BUR MATCHSTICK NEURO 3.0 LAGG (BURR) ×3 IMPLANT
CANISTER SUCT 3000ML PPV (MISCELLANEOUS) ×3 IMPLANT
CAP LCK SPNE (Orthopedic Implant) ×4 IMPLANT
CAP LOCK SPINE RADIUS (Orthopedic Implant) ×4 IMPLANT
CAP LOCKING (Orthopedic Implant) ×8 IMPLANT
CARTRIDGE OIL MAESTRO DRILL (MISCELLANEOUS) ×1 IMPLANT
CLOSURE WOUND 1/2 X4 (GAUZE/BANDAGES/DRESSINGS) ×1
CONT SPEC 4OZ CLIKSEAL STRL BL (MISCELLANEOUS) ×3 IMPLANT
COVER BACK TABLE 60X90IN (DRAPES) ×3 IMPLANT
DECANTER SPIKE VIAL GLASS SM (MISCELLANEOUS) ×3 IMPLANT
DERMABOND ADVANCED (GAUZE/BANDAGES/DRESSINGS) ×2
DERMABOND ADVANCED .7 DNX12 (GAUZE/BANDAGES/DRESSINGS) ×1 IMPLANT
DEVICE INTERBODY ELEVATE 23X8 (Cage) ×4 IMPLANT
DIFFUSER DRILL AIR PNEUMATIC (MISCELLANEOUS) ×3 IMPLANT
DRAPE C-ARM 42X72 X-RAY (DRAPES) ×6 IMPLANT
DRAPE HALF SHEET 40X57 (DRAPES) ×3 IMPLANT
DRAPE LAPAROTOMY 100X72X124 (DRAPES) ×3 IMPLANT
DRAPE SURG 17X23 STRL (DRAPES) ×12 IMPLANT
DRSG OPSITE POSTOP 4X6 (GAUZE/BANDAGES/DRESSINGS) ×3 IMPLANT
DURAPREP 26ML APPLICATOR (WOUND CARE) ×3 IMPLANT
ELECT REM PT RETURN 9FT ADLT (ELECTROSURGICAL) ×3
ELECTRODE REM PT RTRN 9FT ADLT (ELECTROSURGICAL) ×1 IMPLANT
EVACUATOR 1/8 PVC DRAIN (DRAIN) IMPLANT
GAUZE 4X4 16PLY RFD (DISPOSABLE) IMPLANT
GAUZE SPONGE 4X4 12PLY STRL (GAUZE/BANDAGES/DRESSINGS) IMPLANT
GLOVE BIO SURGEON STRL SZ 6.5 (GLOVE) ×2 IMPLANT
GLOVE BIO SURGEON STRL SZ7.5 (GLOVE) ×3 IMPLANT
GLOVE BIO SURGEONS STRL SZ 6.5 (GLOVE) ×1
GLOVE BIOGEL PI IND STRL 6.5 (GLOVE) ×1 IMPLANT
GLOVE BIOGEL PI IND STRL 7.0 (GLOVE) ×4 IMPLANT
GLOVE BIOGEL PI IND STRL 7.5 (GLOVE) ×5 IMPLANT
GLOVE BIOGEL PI INDICATOR 6.5 (GLOVE) ×2
GLOVE BIOGEL PI INDICATOR 7.0 (GLOVE) ×8
GLOVE BIOGEL PI INDICATOR 7.5 (GLOVE) ×10
GLOVE ECLIPSE 9.0 STRL (GLOVE) ×6 IMPLANT
GLOVE EXAM NITRILE LRG STRL (GLOVE) IMPLANT
GLOVE EXAM NITRILE XL STR (GLOVE) IMPLANT
GLOVE EXAM NITRILE XS STR PU (GLOVE) IMPLANT
GOWN STRL REUS W/ TWL LRG LVL3 (GOWN DISPOSABLE) ×2 IMPLANT
GOWN STRL REUS W/ TWL XL LVL3 (GOWN DISPOSABLE) ×4 IMPLANT
GOWN STRL REUS W/TWL 2XL LVL3 (GOWN DISPOSABLE) IMPLANT
GOWN STRL REUS W/TWL LRG LVL3 (GOWN DISPOSABLE) ×4
GOWN STRL REUS W/TWL XL LVL3 (GOWN DISPOSABLE) ×8
KIT BASIN OR (CUSTOM PROCEDURE TRAY) ×3 IMPLANT
KIT TURNOVER KIT B (KITS) ×3 IMPLANT
MILL MEDIUM DISP (BLADE) ×3 IMPLANT
NEEDLE HYPO 22GX1.5 SAFETY (NEEDLE) ×3 IMPLANT
NS IRRIG 1000ML POUR BTL (IV SOLUTION) ×3 IMPLANT
OIL CARTRIDGE MAESTRO DRILL (MISCELLANEOUS) ×3
PACK LAMINECTOMY NEURO (CUSTOM PROCEDURE TRAY) ×3 IMPLANT
ROD RADIUS 40MM (Neuro Prosthesis/Implant) ×4 IMPLANT
ROD SPNL 40X5.5XNS TI RDS (Neuro Prosthesis/Implant) ×2 IMPLANT
SCREW 5.75 X 635 (Screw) ×6 IMPLANT
SCREW 5.75X40M (Screw) ×6 IMPLANT
SPACER SPNL STD 23X8XSTRL (Cage) ×2 IMPLANT
SPCR SPNL STD 23X8XSTRL (Cage) ×2 IMPLANT
SPONGE SURGIFOAM ABS GEL 100 (HEMOSTASIS) ×3 IMPLANT
STRIP CLOSURE SKIN 1/2X4 (GAUZE/BANDAGES/DRESSINGS) ×2 IMPLANT
SUT VIC AB 0 CT1 18XCR BRD8 (SUTURE) ×1 IMPLANT
SUT VIC AB 0 CT1 8-18 (SUTURE) ×2
SUT VIC AB 2-0 CT1 18 (SUTURE) ×3 IMPLANT
SUT VIC AB 3-0 SH 8-18 (SUTURE) ×6 IMPLANT
TOWEL GREEN STERILE (TOWEL DISPOSABLE) ×3 IMPLANT
TOWEL GREEN STERILE FF (TOWEL DISPOSABLE) ×3 IMPLANT
TRAY FOLEY MTR SLVR 16FR STAT (SET/KITS/TRAYS/PACK) ×3 IMPLANT
WATER STERILE IRR 1000ML POUR (IV SOLUTION) ×3 IMPLANT

## 2018-08-27 NOTE — Anesthesia Preprocedure Evaluation (Addendum)
Anesthesia Evaluation  Patient identified by MRN, date of birth, ID band Patient awake    Reviewed: Allergy & Precautions, H&P , NPO status , Patient's Chart, lab work & pertinent test results  History of Anesthesia Complications (+) PONV and history of anesthetic complications  Airway Mallampati: II   Neck ROM: full    Dental  (+) Dental Advidsory Given, Chipped,    Pulmonary asthma , Current Smoker,    breath sounds clear to auscultation       Cardiovascular negative cardio ROS   Rhythm:regular Rate:Normal     Neuro/Psych    GI/Hepatic   Endo/Other    Renal/GU      Musculoskeletal   Abdominal   Peds  Hematology   Anesthesia Other Findings   Reproductive/Obstetrics                            Anesthesia Physical Anesthesia Plan  ASA: II  Anesthesia Plan: General   Post-op Pain Management:    Induction: Intravenous  PONV Risk Score and Plan: 3  Airway Management Planned: Oral ETT  Additional Equipment:   Intra-op Plan:   Post-operative Plan: Extubation in OR  Informed Consent: I have reviewed the patients History and Physical, chart, labs and discussed the procedure including the risks, benefits and alternatives for the proposed anesthesia with the patient or authorized representative who has indicated his/her understanding and acceptance.   Dental Advisory Given  Plan Discussed with: CRNA, Anesthesiologist and Surgeon  Anesthesia Plan Comments:        Anesthesia Quick Evaluation

## 2018-08-27 NOTE — Anesthesia Procedure Notes (Signed)
Procedure Name: Intubation Date/Time: 08/27/2018 7:58 AM Performed by: Neldon Newport, CRNA Pre-anesthesia Checklist: Timeout performed, Patient being monitored, Suction available, Emergency Drugs available and Patient identified Patient Re-evaluated:Patient Re-evaluated prior to induction Oxygen Delivery Method: Circle system utilized Preoxygenation: Pre-oxygenation with 100% oxygen Induction Type: IV induction Ventilation: Mask ventilation without difficulty Laryngoscope Size: Mac and 3 Grade View: Grade I Tube type: Oral Tube size: 7.0 mm Number of attempts: 1 Placement Confirmation: breath sounds checked- equal and bilateral,  positive ETCO2 and ETT inserted through vocal cords under direct vision Secured at: 22 cm Tube secured with: Tape Dental Injury: Teeth and Oropharynx as per pre-operative assessment

## 2018-08-27 NOTE — Anesthesia Postprocedure Evaluation (Signed)
Anesthesia Post Note  Patient: Wendy Lynn  Procedure(s) Performed: Posterior Lumbar Interbody Fusion - Lumbar five-Sacral one (N/A Back)     Patient location during evaluation: PACU Anesthesia Type: General Level of consciousness: awake and alert Pain management: pain level controlled Vital Signs Assessment: post-procedure vital signs reviewed and stable Respiratory status: spontaneous breathing, nonlabored ventilation, respiratory function stable and patient connected to nasal cannula oxygen Cardiovascular status: blood pressure returned to baseline and stable Postop Assessment: no apparent nausea or vomiting Anesthetic complications: no    Last Vitals:  Vitals:   08/27/18 1208 08/27/18 1601  BP: 125/71 113/78  Pulse: 74 79  Resp: 16 16  Temp: 36.5 C 36.8 C  SpO2: 99% 95%    Last Pain:  Vitals:   08/27/18 1605  TempSrc:   PainSc: 8                  Gwenda Heiner S

## 2018-08-27 NOTE — H&P (Signed)
  Wendy Lynn is an 52 y.o. female.   Chief Complaint: Back pain HPI: 52 year old female with chronic and progressive back pain with bilateral lower extremity symptoms failing conservative management.  Work-up demonstrates evidence of progressive disc degeneration with disc space collapse and marked foraminal stenosis.  Patient status post prior right-sided L5-S1 laminotomy microdiscectomy.  She has chronic right dorsiflexion weakness and some L5 neuropathic pain following the surgery.  She also has some retained stainless steel instrumentation within the disc space from her prior discectomy.  Patient has failed conservative management.  She presents now for decompression and fusion in hopes of improving her symptoms.  Past Medical History:  Diagnosis Date  . Asthma    AS A CHILD, GREW OUT OF IT AROUND 24 YR OF AGE  . PONV (postoperative nausea and vomiting)    WITH PREVOUS BACK SURGERY.  Marland Kitchen Ulcer     Past Surgical History:  Procedure Laterality Date  . ABDOMINAL HYSTERECTOMY    . BACK SURGERY    . ELBOW SURGERY     LEFT     Family History  Problem Relation Age of Onset  . Heart failure Father    Social History:  reports that she has been smoking cigarettes. She has a 13.50 pack-year smoking history. She has never used smokeless tobacco. She reports that she does not drink alcohol or use drugs.  Allergies: No Known Allergies  Medications Prior to Admission  Medication Sig Dispense Refill  . aspirin-acetaminophen-caffeine (EXCEDRIN MIGRAINE) 250-250-65 MG tablet Take 2 tablets by mouth daily as needed for headache.    Marland Kitchen HYDROcodone-acetaminophen (NORCO) 10-325 MG tablet Take 1 tablet by mouth every 6 (six) hours as needed for pain.  0  . cetirizine (ZYRTEC) 10 MG tablet Take 10 mg by mouth daily as needed for allergies.      No results found for this or any previous visit (from the past 48 hour(s)). No results found.  Pertinent items noted in HPI and remainder of comprehensive  ROS otherwise negative.  Blood pressure 127/78, pulse (!) 59, temperature 97.8 F (36.6 C), temperature source Oral, resp. rate 20, weight 98 kg, SpO2 100 %.  Patient is awake and alert.  She is oriented and appropriate.  Speech is fluent.  Judgment and insight are intact.  Cranial nerve function normal bilaterally.  Motor examination with weakness of her right sided extensor hallucis longus grading out at 1/5.  She has weakness of her right sided ear tibialis grading out at 2/5.  Remainder of her motor strength intact.  Sensory examination with decreased sensation and some hyperesthesia in her right L5 dermatome.  Deep and refuses normal active.  No evidence of long track signs.  Examination head ears eyes nose throat is unremarkable her chest and abdomen are benign.  Extremities are free from injury deformity. Assessment/Plan L5-S1 degenerative disc disease with severe disc space collapse and foraminal stenosis and chronic back pain with radiculopathy.  Plan bilateral L5-S1 decompressive laminotomies and foraminotomies followed by posterior lumbar interbody fusion utilizing interbody cages, locally harvested autograft, and augmented with posterior lateral arthrodesis utilizing nonsegmental pedicle screw fixation and local autografting.  Risks and benefits been explained.  Patient wishes to proceed.  Sherilyn Cooter A Ava Tangney 08/27/2018, 7:42 AM

## 2018-08-27 NOTE — Transfer of Care (Signed)
Immediate Anesthesia Transfer of Care Note  Patient: Wendy Lynn  Procedure(s) Performed: Posterior Lumbar Interbody Fusion - Lumbar five-Sacral one (N/A Back)  Patient Location: PACU  Anesthesia Type:General  Level of Consciousness: awake, alert  and oriented  Airway & Oxygen Therapy: Patient Spontanous Breathing and Patient connected to nasal cannula oxygen  Post-op Assessment: Report given to RN, Post -op Vital signs reviewed and stable and Patient moving all extremities X 4  Post vital signs: Reviewed and stable  Last Vitals:  Vitals Value Taken Time  BP    Temp 36.4 C 08/27/2018 10:50 AM  Pulse 82 08/27/2018 10:50 AM  Resp 22 08/27/2018 10:50 AM  SpO2 92 % 08/27/2018 10:50 AM  Vitals shown include unvalidated device data.  Last Pain:  Vitals:   08/27/18 0632  TempSrc:   PainSc: 6       Patients Stated Pain Goal: 6 (08/27/18 1975)  Complications: No apparent anesthesia complications

## 2018-08-27 NOTE — Op Note (Signed)
Date of procedure: 08/27/2018  Date of dictation: Same  Service: Neurosurgery  Preoperative diagnosis: L5-S1 degenerative disc disease with foraminal stenosis and radiculopathy  Postoperative diagnosis: Same  Procedure Name: Bilateral L5-S1 redo decompressive laminotomies and foraminotomies, more than would be required for simple interbody fusion alone.  L5-S1 posterior lumbar interbody fusion utilizing interbody cages and locally harvested autograft  L5-S1 posterior lateral arthrodesis utilizing nonsegmental pedicle screw fixation and local autograft  Removal of iatrogenic retained foreign body  Surgeon:Zehava Turski A.Prezley Qadir, M.D.  Asst. Surgeon: Doran Durand, NP  Anesthesia: General  Indication: 52 year old female status post prior lumbar laminotomy and microdiscectomy.  Patient with chronic and worsening back and bilateral lower extremity symptoms failing all conservative management.  Work-up demonstrates evidence of progressive disc space collapse with severe foraminal stenosis.  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  Patient presents now for L5-S1 decompression and fusion.  Operative note: After induction of anesthesia, patient position prone on the Wilson frame and appropriately padded.  Lumbar region prepped and draped sterilely.  Incision made overlying L5-S1.  Dissection performed bilaterally.  Retractor placed.  Fluoroscopy used.  Levels confirmed.  Previous laminotomy site on the right at L5-S1 disc was dissected free.  Redo laminotomy was then performed using Leksell Aundria Rud and Kerrison Aundria Rud to remove the inferior two thirds of the lamina of L5 entire inferior facet of L5 was removed bilaterally superior facet of S1 were removed bilaterally.  Superior rim of the S1 lamina were removed bilaterally.  Bilateral discectomies then performed at L5-S1.  On the patient's left side the retained metallic fragment was identified and removed completely.   The space then prepared for interbody fusion.  With disc space distracted in the patient's right side the space was cleaned of all soft tissue on the left side.  An 8 mm [employer] expandable cage packed with locally harvested autograft was then impacted into place and expanded to its full extent.  Distractor was removed from patient's right side.  The space was prepared on the right side.  Morselized autograft was packed in the interspace.  A second cage was then impacted in the place and expanded to its full extent.  Pedicles of L5 and S1 were identified using surface landmarks and intraoperative fluoroscopy.  Superficial bone overlying the pedicle was then removed using high-speed drill.  Pedicle was then probed using pedicle all each pedicle all track was then probed and found to be solidly within bone.  5.75 mm radius bran screws from [employer] were placed bilaterally at L5 and S1.  Final images reveal good position of the cages and the hardware at the proper operative level with normal alignment of the spine.  Wound was then irrigated one final time.  Transverse processes and sacral ala were decorticated.  Morselized autograft was packed posterior laterally.  Short segment titanium rod was then placed over the screw heads at L5 and S1.  Locking caps placed over the screws.  Locking caps and engaged.  Gelfoam was placed over the laminotomy defects.  Wounds and closed in layers with Vicryl sutures.  Steri-Strips and sterile dressing were applied.  No apparent complications.  Patient tolerated the procedure well and she returns to the recovery room postop.

## 2018-08-27 NOTE — Brief Op Note (Signed)
08/27/2018  10:41 AM  PATIENT:  [name]  52 y.o. female  PRE-OPERATIVE DIAGNOSIS:  Stenosis  POST-OPERATIVE DIAGNOSIS:  Stenosis  PROCEDURE:  Procedure(s): Posterior Lumbar Interbody Fusion - Lumbar five-Sacral one (N/A)  SURGEON:  Surgeon(s) and Role:    Julio Sicks, MD - Primary  PHYSICIAN ASSISTANT:   ASSISTANTSMarland Mcalpine   ANESTHESIA:   general  EBL:  200 mL   BLOOD ADMINISTERED:none  DRAINS: none   LOCAL MEDICATIONS USED:  MARCAINE     SPECIMEN:  No Specimen  DISPOSITION OF SPECIMEN:  N/A  COUNTS:  YES  TOURNIQUET:  * No tourniquets in log *  DICTATION: .Dragon Dictation  PLAN OF CARE: Admit to inpatient   PATIENT DISPOSITION:  PACU - hemodynamically stable.   Delay start of Pharmacological VTE agent (>24hrs) due to surgical blood loss or risk of bleeding: yes

## 2018-08-27 NOTE — Evaluation (Signed)
Physical Therapy Evaluation Patient Details Name: Wendy Lynn MRN: [id number] DOB: January 27, 1966 Today's Date: 08/27/2018   History of Present Illness  Pt is a 52 y/o female s/p L5-S1 PLIF. PMH includes back surgery.   Clinical Impression  Patient is s/p above surgery resulting in the deficits listed below (see PT Problem List). Pt very guarded and limited in mobility tolerance secondary to "wooziness." Required min guard for mobility using RW. Educated about back precautions and generalized walking program. Patient will benefit from skilled PT to increase their independence and  safety with mobility (while adhering to their precautions) to allow discharge to the venue listed below.     Follow Up Recommendations No PT follow up;Supervision for mobility/OOB    Equipment Recommendations  Rolling walker with 5" wheels    Recommendations for Other Services       Precautions / Restrictions Precautions Precautions: Back Precaution Booklet Issued: Yes (comment) Precaution Comments: Reviewed back precautions with pt.  Required Braces or Orthoses: Spinal Brace Spinal Brace: Lumbar corset;Applied in sitting position Restrictions Weight Bearing Restrictions: No      Mobility  Bed Mobility Overal bed mobility: Needs Assistance Bed Mobility: Rolling;Sidelying to Sit;Sit to Sidelying Rolling: Min assist Sidelying to sit: Supervision     Sit to sidelying: Supervision General bed mobility comments: Min A to assist with rolling to side. Supervision and increased time to come to sitting and return to sidelying.   Transfers Overall transfer level: Needs assistance Equipment used: Rolling walker (2 wheeled) Transfers: Sit to/from Stand Sit to Stand: Min guard;From elevated surface         General transfer comment: Min guard from elevated surface for safety. Verbal cues for safe hand placement.   Ambulation/Gait Ambulation/Gait assistance: Min guard Gait Distance (Feet): 100  Feet Assistive device: Rolling walker (2 wheeled) Gait Pattern/deviations: Step-through pattern;Decreased stride length Gait velocity: Decreased    General Gait Details: Slow, very guarded gait, however, overall steady using RW. Pt reporting increased wooziness during gait, so distance limited. Educated about generalized walking program to perform at home.   Stairs            Wheelchair Mobility    Modified Rankin (Stroke Patients Only)       Balance Overall balance assessment: Needs assistance Sitting-balance support: No upper extremity supported;Feet supported Sitting balance-Leahy Scale: Good     Standing balance support: Bilateral upper extremity supported;No upper extremity supported;During functional activity Standing balance-Leahy Scale: Fair Standing balance comment: Able to maintain static standing at sink without UE support                              Pertinent Vitals/Pain Pain Assessment: Faces Faces Pain Scale: Hurts even more Pain Location: back  Pain Descriptors / Indicators: Aching;Operative site guarding Pain Intervention(s): Monitored during session;Limited activity within patient's tolerance;Repositioned    Home Living Family/patient expects to be discharged to:: Private residence Living Arrangements: Spouse/significant other Available Help at Discharge: Family;Available 24 hours/day Type of Home: House Home Access: Stairs to enter Entrance Stairs-Rails: Doctor, general practice of Steps: 2 Home Layout: One level Home Equipment: Grab bars - tub/shower      Prior Function Level of Independence: Independent               Hand Dominance        Extremity/Trunk Assessment   Upper Extremity Assessment Upper Extremity Assessment: Defer to OT evaluation    Lower Extremity Assessment Lower Extremity  Assessment: RLE deficits/detail;Generalized weakness RLE Deficits / Details: Reports some numbness in RLE      Cervical / Trunk Assessment Cervical / Trunk Assessment: Other exceptions Cervical / Trunk Exceptions: s/p PLIF   Communication   Communication: No difficulties  Cognition Arousal/Alertness: Suspect due to medications Behavior During Therapy: WFL for tasks assessed/performed Overall Cognitive Status: Within Functional Limits for tasks assessed                                 General Comments: Pt sleepy throughout, however, easily arousable       General Comments General comments (skin integrity, edema, etc.): Pt's mother and husband present during session.     Exercises     Assessment/Plan    PT Assessment Patient needs continued PT services  PT Problem List Decreased strength;Decreased mobility;Decreased activity tolerance;Decreased knowledge of use of DME;Decreased knowledge of precautions;Impaired sensation;Pain       PT Treatment Interventions DME instruction;Gait training;Stair training;Functional mobility training;Therapeutic activities;Therapeutic exercise;Balance training;Patient/family education    PT Goals (Current goals can be found in the Care Plan section)  Acute Rehab PT Goals Patient Stated Goal: to go home  PT Goal Formulation: With patient Time For Goal Achievement: 09/10/18 Potential to Achieve Goals: Good    Frequency Min 5X/week   Barriers to discharge        Co-evaluation               AM-PAC PT "6 Clicks" Daily Activity  Outcome Measure Difficulty turning over in bed (including adjusting bedclothes, sheets and blankets)?: Unable Difficulty moving from lying on back to sitting on the side of the bed? : Unable Difficulty sitting down on and standing up from a chair with arms (e.g., wheelchair, bedside commode, etc,.)?: Unable Help needed moving to and from a bed to chair (including a wheelchair)?: A Little Help needed walking in hospital room?: A Little Help needed climbing 3-5 steps with a railing? : A Lot 6 Click Score:  11    End of Session Equipment Utilized During Treatment: Gait belt;Back brace Activity Tolerance: Treatment limited secondary to medical complications (Comment)("wooziness" ) Patient left: in bed;with call bell/phone within reach;with family/visitor present Nurse Communication: Mobility status PT Visit Diagnosis: Other abnormalities of gait and mobility (R26.89);Muscle weakness (generalized) (M62.81);Pain Pain - part of body: (back )    Time: 1610-9604 PT Time Calculation (min) (ACUTE ONLY): 35 min   Charges:   PT Evaluation $PT Eval Low Complexity: 1 Low PT Treatments $Gait Training: 8-22 mins        Gladys Damme, PT, DPT  Acute Rehabilitation Services  Pager: [phone]   [facility] Prom 08/27/2018, 1:41 PM

## 2018-08-27 NOTE — Progress Notes (Signed)
Occupational Therapy Evaluation Patient Details Name: Wendy Lynn MRN: [id number] DOB: 1966/10/01 Today's Date: 08/27/2018    History of Present Illness Pt is a 52 y/o female s/p L5-S1 PLIF. PMH includes back surgery.    Clinical Impression   PTA, pt was living with her husband and was independent. Currently, pt requires supervision for ADLs and functional mobility. Provided education and handout on back precautions, bed mobility, brace management, LB ADLs, toileting, and tub transfer; pt demonstrated and verbalized understanding. Answered all pt questions. Recommend dc home once medically stable per physician. All acute OT needs met and will sign off. Thank you.     Follow Up Recommendations  No OT follow up;Supervision/Assistance - 24 hour    Equipment Recommendations  None recommended by OT    Recommendations for Other Services PT consult     Precautions / Restrictions Precautions Precautions: Back Precaution Booklet Issued: Yes (comment) Precaution Comments: Reviewed back precautions with pt.  Required Braces or Orthoses: Spinal Brace Spinal Brace: Lumbar corset;Applied in sitting position Restrictions Weight Bearing Restrictions: No      Mobility Bed Mobility Overal bed mobility: Needs Assistance Bed Mobility: Rolling;Sidelying to Sit Rolling: Supervision Sidelying to sit: Supervision     Sit to sidelying: Supervision General bed mobility comments: supervision for safety. Min VCs for sequencing of log roll. Bed positioned to simulated home  Transfers Overall transfer level: Needs assistance Equipment used: Rolling walker (2 wheeled) Transfers: Sit to/from Stand Sit to Stand: Supervision         General transfer comment: supervision for safety    Balance Overall balance assessment: Needs assistance Sitting-balance support: No upper extremity supported;Feet supported Sitting balance-Leahy Scale: Good     Standing balance support: Bilateral upper  extremity supported;No upper extremity supported;During functional activity Standing balance-Leahy Scale: Fair Standing balance comment: Able to maintain static standing at sink without UE support                            ADL either performed or assessed with clinical judgement   ADL Overall ADL's : Needs assistance/impaired Eating/Feeding: Independent   Grooming: Supervision/safety;Wash/dry hands;Standing Grooming Details (indicate cue type and reason): supervision for safety Upper Body Bathing: Supervision/ safety;Sitting   Lower Body Bathing: Supervison/ safety;Sit to/from stand Lower Body Bathing Details (indicate cue type and reason): supervision for safety Upper Body Dressing : Supervision/safety;Sitting Upper Body Dressing Details (indicate cue type and reason): pt doning brace with supervision Lower Body Dressing: Supervision/safety;Sit to/from stand Lower Body Dressing Details (indicate cue type and reason): Educating pt on LB dressing techniques for adherance to back precautions. Pt donning underwear and socks with supervision demonstrating understanding of compensatory techniques. Toilet Transfer: Supervision/safety;Regular Toilet;RW;Grab bars   Writer and Hygiene: Supervision/safety;Sitting/lateral Chartered certified accountant Details (indicate cue type and reason): Educating pt and husband on tub transfers. Both verablized understanding.  Functional mobility during ADLs: Supervision/safety;Rolling walker General ADL Comments: Pt performing ADLs and functional mobility at supervision level. Educated pt on back precautions, brace managment, bed mobility, LB ADLs, toileting, and tub transfer.     Vision Baseline Vision/History: Wears glasses Wears Glasses: At all times Patient Visual Report: No change from baseline       Perception     Praxis      Pertinent Vitals/Pain Pain Assessment: Faces Faces Pain Scale: Hurts even  more Pain Location: back  Pain Descriptors / Indicators: Aching;Operative site guarding Pain Intervention(s): Repositioned;Premedicated before session;Monitored  during session     Hand Dominance Right   Extremity/Trunk Assessment Upper Extremity Assessment Upper Extremity Assessment: Overall WFL for tasks assessed   Lower Extremity Assessment Lower Extremity Assessment: Defer to PT evaluation RLE Deficits / Details: Reports some numbness in RLE    Cervical / Trunk Assessment Cervical / Trunk Assessment: Other exceptions Cervical / Trunk Exceptions: s/p PLIF    Communication Communication Communication: No difficulties   Cognition Arousal/Alertness: Awake/alert Behavior During Therapy: WFL for tasks assessed/performed Overall Cognitive Status: Within Functional Limits for tasks assessed                                 General Comments: Pt sleepy throughout, however, easily arousable    General Comments  Husband present throughout    Exercises     Shoulder Instructions      Home Living Family/patient expects to be discharged to:: Private residence Living Arrangements: Spouse/significant other Available Help at Discharge: Family;Available 24 hours/day Type of Home: House Home Access: Stairs to enter [employer] of Steps: 2 Entrance Stairs-Rails: Right;Left Home Layout: One level     Bathroom Shower/Tub: Teacher, early years/pre: Standard     Home Equipment: Grab bars - tub/shower          Prior Functioning/Environment Level of Independence: Independent                 OT Problem List: Decreased range of motion;Decreased activity tolerance;Impaired balance (sitting and/or standing);Decreased knowledge of use of DME or AE;Decreased knowledge of precautions;Pain      OT Treatment/Interventions:      OT Goals(Current goals can be found in the care plan section) Acute Rehab OT Goals Patient Stated Goal: to go home   OT Goal Formulation: All assessment and education complete, DC therapy  OT Frequency:     Barriers to D/C:            Co-evaluation              AM-PAC PT "6 Clicks" Daily Activity     Outcome Measure Help from another person eating meals?: None Help from another person taking care of personal grooming?: None Help from another person toileting, which includes using toliet, bedpan, or urinal?: None Help from another person bathing (including washing, rinsing, drying)?: None Help from another person to put on and taking off regular upper body clothing?: None Help from another person to put on and taking off regular lower body clothing?: None 6 Click Score: 24   End of Session Equipment Utilized During Treatment: Rolling walker;Back brace Nurse Communication: Mobility status;Precautions  Activity Tolerance: Patient tolerated treatment well Patient left: in chair;with call bell/phone within reach  OT Visit Diagnosis: Unsteadiness on feet (R26.81);Other abnormalities of gait and mobility (R26.89);Muscle weakness (generalized) (M62.81);Pain Pain - part of body: (Back)                Time: 1683-7290 OT Time Calculation (min): 20 min Charges:  OT General Charges $OT Visit: 1 Visit OT Evaluation $OT Eval Low Complexity: 1 Low  Andriel Omalley MSOT, OTR/L Acute Rehab Pager: [phone] Office: [place] 08/27/2018, 4:38 PM

## 2018-08-27 NOTE — Plan of Care (Signed)
  Problem: Safety: Goal: Ability to remain free from injury will improve Outcome: Progressing   

## 2018-08-28 MED ORDER — CYCLOBENZAPRINE HCL 10 MG PO TABS: 10.0000 mg | ORAL_TABLET | Freq: Three times a day (TID) | ORAL | 0 refills | Status: DC | PRN

## 2018-08-28 MED ORDER — OXYCODONE HCL 10 MG PO TABS: 5.0000 mg | ORAL_TABLET | ORAL | 0 refills | Status: DC | PRN

## 2018-08-28 MED FILL — Thrombin (Recombinant) For Soln 20000 Unit: CUTANEOUS | Qty: 1 | Status: AC

## 2018-08-28 NOTE — Progress Notes (Signed)
Physical Therapy Treatment   Pt progressing towards goals, ambulating with gross min guard to supervision with RW for safety. Educated pt on brace adjustments,positioning, and car transfers. Discussed dressing and toileting concerns, informing pt on optional ADL assist bag available in gift shop if interested to improve independence.  Recommending RW at d/c to improve safety.Will continue to follow and progress as able per POC.   08/28/18 1026  PT Visit Information  Last PT Received On 08/28/18  Assistance Needed +1  History of Present Illness Pt is a 52 y/o female s/p L5-S1 PLIF. PMH includes back surgery.   Subjective Data  Patient Stated Goal to go home   Precautions  Precautions Back  Precaution Comments Reviewed back precautions with pt.   Required Braces or Orthoses Spinal Brace  Spinal Brace Lumbar corset;Applied in sitting position (adjusted in standing)  Restrictions  Weight Bearing Restrictions No  Pain Assessment  Pain Assessment 0-10  Pain Score 9  Faces Pain Scale 6  Pain Location back   Pain Descriptors / Indicators Discomfort;Grimacing;Pressure;Tender  Pain Intervention(s) Limited activity within patient's tolerance;Monitored during session;Repositioned  Cognition  Arousal/Alertness Awake/alert  Behavior During Therapy WFL for tasks assessed/performed  Overall Cognitive Status Within Functional Limits for tasks assessed  General Comments Pt recalled 3/3 precautions   Bed Mobility  General bed mobility comments Pt seated EOB upon arrival. Verbally reviewed log roll.   Transfers  Overall transfer level Needs assistance  Equipment used Rolling walker (2 wheeled)  Transfers Sit to/from Stand  Sit to Stand Supervision  General transfer comment Supervision for safety. Increased time and effort noted secondary to pain. Bed elevated to simulate height of bed at home. Min cues for hand placement.   Ambulation/Gait  Ambulation/Gait assistance Min guard;Supervision  Gait  Distance (Feet) 300 Feet  Assistive device Rolling walker (2 wheeled)  Gait Pattern/deviations Step-through pattern  General Gait Details Pt continues to ambulate slow and guarded. Min cues for upright posture with good carryover, as pt able to self correct without cues near end of session.   Gait velocity Decreased   Gait velocity interpretation <1.31 ft/sec, indicative of household ambulator  Stairs Yes  Stairs assistance Min assist  Stair Management One rail Right;Step to pattern;Forwards  Number of Stairs 2  General stair comments Min cues for position of feet with descent to improve safety. Ascended forwards with stronger LE leading and descended forwards with weaker LE leading. HHA+1 utilized as pt unable to reach both rails at once as she is able to do at d/c.   Balance  Overall balance assessment Needs assistance  Sitting-balance support No upper extremity supported;Feet supported  Sitting balance-Leahy Scale Good  Standing balance support Bilateral upper extremity supported;No upper extremity supported;During functional activity  Standing balance-Leahy Scale Fair  Standing balance comment Performed static standing with use of single UE support; requires bil UE support for dynamic standing activities   General Comments  General comments (skin integrity, edema, etc.) Hustband present and engaged throughout   PT - End of Session  Equipment Utilized During Treatment Gait belt;Back brace  Activity Tolerance Patient tolerated treatment well  Patient left in chair;with call bell/phone within reach;with family/visitor present  Nurse Communication Mobility status   PT - Assessment/Plan  PT Plan Current plan remains appropriate  PT Visit Diagnosis Other abnormalities of gait and mobility (R26.89);Muscle weakness (generalized) (M62.81);Pain  Pain - part of body  (back)  PT Frequency (ACUTE ONLY) Min 5X/week  Follow Up Recommendations No PT follow up;Supervision for  mobility/OOB  PT  equipment Rolling walker with 5" wheels  AM-PAC PT "6 Clicks" Daily Activity Outcome Measure  Difficulty turning over in bed (including adjusting bedclothes, sheets and blankets)? 3  Difficulty moving from lying on back to sitting on the side of the bed?  3  Difficulty sitting down on and standing up from a chair with arms (e.g., wheelchair, bedside commode, etc,.)? 3  Help needed moving to and from a bed to chair (including a wheelchair)? 3  Help needed walking in hospital room? 3  Help needed climbing 3-5 steps with a railing?  3  6 Click Score 18  Mobility G Code  CK  PT Goal Progression  Progress towards PT goals Progressing toward goals  Acute Rehab PT Goals  PT Goal Formulation With patient  Time For Goal Achievement 09/10/18  Potential to Achieve Goals Good  PT Time Calculation  PT Start Time (ACUTE ONLY) 8003  PT Stop Time (ACUTE ONLY) 0955  PT Time Calculation (min) (ACUTE ONLY) 28 min  PT General Charges  $$ ACUTE PT VISIT 1 Visit  PT Treatments  $Gait Training 23-37 mins

## 2018-08-28 NOTE — Discharge Summary (Signed)
Physician Discharge Summary  Patient ID: Wendy Lynn MRN: [id number] DOB/AGE: Oct 31, 1966 52 y.o.  Admit date: 08/27/2018 Discharge date: 08/28/2018  Admission Diagnoses:  Discharge Diagnoses:  Active Problems:   Lumbar foraminal stenosis   Discharged Condition: good  Hospital Course: Patient admitted to the hospital where she underwent uncomplicated lumbar decompression and fusion.  Postoperatively doing well.  Preoperative back and lower extremity pain much improved.  Ambulating better.  Ready for discharge home.  Consults:   Significant Diagnostic Studies:   Treatments:   Discharge Exam: Blood pressure 125/69, pulse 71, temperature 98.9 F (37.2 C), temperature source Oral, resp. rate 16, height 5\' 2"  (1.575 m), weight 98 kg, SpO2 98 %. Awake and alert.  Oriented and appropriate.  Cranial nerve function intact.  Motor examination with some continued right dorsiflexion weakness which is stable from preop.  Otherwise motor strength intact.  Sensory examination stable.  Wound clean and dry.  Chest and abdomen benign.  Disposition: Discharge disposition: 01-Home or Self Care        Allergies as of 08/28/2018   No Known Allergies     Medication List    STOP taking these medications   HYDROcodone-acetaminophen 10-325 MG tablet Commonly known as:  NORCO     TAKE these medications   aspirin-acetaminophen-caffeine 250-250-65 MG tablet Commonly known as:  EXCEDRIN MIGRAINE Take 2 tablets by mouth daily as needed for headache.   cetirizine 10 MG tablet Commonly known as:  ZYRTEC Take 10 mg by mouth daily as needed for allergies.   cyclobenzaprine 10 MG tablet Commonly known as:  FLEXERIL Take 1 tablet (10 mg total) by mouth 3 (three) times daily as needed for muscle spasms.   Oxycodone HCl 10 MG Tabs Take 0.5-1 tablets (5-10 mg total) by mouth every 3 (three) hours as needed ((score 7 to 10)).            Durable Medical Equipment  (From admission, onward)          Start     Ordered   08/27/18 1159  DME Walker rolling  Once    Question:  Patient needs a walker to treat with the following condition  Answer:  Lumbar foraminal stenosis   08/27/18 1158   08/27/18 1159  DME 3 n 1  Once     08/27/18 1158           Signed: Sherilyn Cooter A Alyx Gee 08/28/2018, 8:06 AM

## 2018-08-28 NOTE — Discharge Instructions (Signed)

## 2018-08-28 NOTE — Progress Notes (Signed)
Patient is discharged from room 3C07 at this time. Alert and in stable condition. IV site d/c'd and instructions read to patient and spouse with understanding verbalized. Left unit via wheelchair with all belongings at side. 

## 2018-08-30 MED FILL — Sodium Chloride IV Soln 0.9%: INTRAVENOUS | Qty: 1000 | Status: AC

## 2018-08-30 MED FILL — Heparin Sodium (Porcine) Inj 1000 Unit/ML: INTRAMUSCULAR | Qty: 30 | Status: AC

## 2019-02-08 ENCOUNTER — Emergency Department (HOSPITAL_BASED_OUTPATIENT_CLINIC_OR_DEPARTMENT_OTHER)
Admission: EM | Admit: 2019-02-08 | Discharge: 2019-02-08 | Disposition: A | Discharge status: Home / Self Care | Payer: BLUE CROSS/BLUE SHIELD | Attending: Emergency Medicine | Admitting: Emergency Medicine

## 2019-02-08 ENCOUNTER — Emergency Department (HOSPITAL_BASED_OUTPATIENT_CLINIC_OR_DEPARTMENT_OTHER): Payer: BLUE CROSS/BLUE SHIELD

## 2019-02-08 ENCOUNTER — Encounter (HOSPITAL_BASED_OUTPATIENT_CLINIC_OR_DEPARTMENT_OTHER): Payer: Self-pay | Admitting: Emergency Medicine

## 2019-02-08 ENCOUNTER — Other Ambulatory Visit: Payer: Self-pay

## 2019-02-08 DIAGNOSIS — J45909 Unspecified asthma, uncomplicated: Secondary | ICD-10-CM | POA: Insufficient documentation

## 2019-02-08 DIAGNOSIS — F17210 Nicotine dependence, cigarettes, uncomplicated: Secondary | ICD-10-CM | POA: Diagnosis not present

## 2019-02-08 DIAGNOSIS — M25572 Pain in left ankle and joints of left foot: Secondary | ICD-10-CM | POA: Diagnosis not present

## 2019-02-08 DIAGNOSIS — X501XXA Overexertion from prolonged static or awkward postures, initial encounter: Secondary | ICD-10-CM | POA: Insufficient documentation

## 2019-02-08 MED ORDER — HYDROCODONE-ACETAMINOPHEN 5-325 MG PO TABS: 1.0000 | ORAL_TABLET | Freq: Four times a day (QID) | ORAL | 0 refills | Status: DC | PRN

## 2019-02-08 MED ORDER — HYDROCODONE-ACETAMINOPHEN 5-325 MG PO TABS
1.0000 | ORAL_TABLET | Freq: Once | ORAL | Status: AC
  Administered 2019-02-08: 1 via ORAL
  Filled 2019-02-08: qty 1

## 2019-02-08 NOTE — Discharge Instructions (Signed)
Medications: Ibuprofen, Tylenol  Treatment: Take ibuprofen every 8 hours to help with pain and swelling.  For severe, breakthrough pain, you can take 1 Norco every 6 hours.  Do not drive or operate machinery while taking this medication.  Use ice 3-4 times daily alternating 20 minutes on, 20 minutes off. Keep your leg elevated whenever you're not walking on it. Wear your splint at all times except when bathing. Use scooter at all times initially and begin partial weightbearing as tolerated.  Follow-up: Please follow-up with the orthopedic doctor if your symptoms are not improving over the next 7-10 days. Please return to the emergency department if you develop any new or worsening symptoms.  Do not drink alcohol, drive, operate machinery or participate in any other potentially dangerous activities while taking opiate pain medication as it may make you sleepy. Do not take this medication with any other sedating medications, either prescription or over-the-counter. If you were prescribed Percocet or Vicodin, do not take these with acetaminophen (Tylenol) as it is already contained within these medications and overdose of Tylenol is dangerous.   This medication is an opiate (or narcotic) pain medication and can be habit forming.  Use it as little as possible to achieve adequate pain control.  Do not use or use it with extreme caution if you have a history of opiate abuse or dependence. This medication is intended for your use only - do not give any to anyone else and keep it in a secure place where nobody else, especially children, have access to it. It will also cause or worsen constipation, so you may want to consider taking an over-the-counter stool softener while you are taking this medication.

## 2019-02-08 NOTE — ED Triage Notes (Signed)
Pt states she has been taking ibuprofen last dose 1830

## 2019-02-08 NOTE — ED Provider Notes (Signed)
[facility] EMERGENCY DEPARTMENT Provider Note   CSN: [id number] Arrival date & time: 02/08/19  2100     History   Chief Complaint Chief Complaint  Patient presents with  . Ankle Injury    HPI Wendy Lynn is a 53 y.o. female who presents with left ankle pain after she stepped on a rock and twisted her ankle earlier today.  She has not been able to bear much weight on the ankle without pain.  She did not fall.  She denies any other injuries.  She denies any numbness or tingling.  She has been taking ibuprofen throughout the day without relief.  She had an elevated.  She did not use ice.  HPI  Past Medical History:  Diagnosis Date  . Asthma    AS A CHILD, GREW OUT OF IT AROUND 24 YR OF AGE  . PONV (postoperative nausea and vomiting)    WITH PREVOUS BACK SURGERY.  Marland Kitchen. Ulcer     Patient Active Problem List   Diagnosis Date Noted  . Lumbar foraminal stenosis 08/27/2018    Past Surgical History:  Procedure Laterality Date  . ABDOMINAL HYSTERECTOMY    . BACK SURGERY    . ELBOW SURGERY     LEFT      OB History   No obstetric history on file.      Home Medications    Prior to Admission medications   Medication Sig Start Date End Date Taking? Authorizing Provider  aspirin-acetaminophen-caffeine (EXCEDRIN MIGRAINE) [phone] [id number] MG tablet Take 2 tablets by mouth daily as needed for headache.    [provider]  cetirizine (ZYRTEC) 10 MG tablet Take 10 mg by mouth daily as needed for allergies.    [provider]  cyclobenzaprine (FLEXERIL) 10 MG tablet Take 1 tablet (10 mg total) by mouth 3 (three) times daily as needed for muscle spasms. 08/28/18   Julio SicksPool, Henry, MD  HYDROcodone-acetaminophen (NORCO/VICODIN) 5-325 MG tablet Take 1 tablet by mouth every 6 (six) hours as needed. 02/08/19   Lenardo Westwood, Waylan BogaAlexandra M, PA-C  oxyCODONE 10 MG TABS Take 0.5-1 tablets (5-10 mg total) by mouth every 3 (three) hours as needed ((score 7 to 10)). 08/28/18   Julio SicksPool, Henry, MD      Family History Family History  Problem Relation Age of Onset  . Heart failure Father     Social History Social History   Tobacco Use  . Smoking status: Current Every Day Smoker    Packs/day: 0.50    Years: 27.00    Pack years: 13.50    Types: Cigarettes  . Smokeless tobacco: Never Used  Substance Use Topics  . Alcohol use: No  . Drug use: No     Allergies   Patient has no known allergies.   Review of Systems Review of Systems  Musculoskeletal: Positive for arthralgias and joint swelling.  Neurological: Negative for numbness.     Physical Exam Updated Vital Signs BP (!) 136/98   Pulse 66   Temp 97.6 F (36.4 C) (Oral)   Resp 18   Ht 5\' 2"  (1.575 m)   Wt 104.3 kg   SpO2 99%   BMI 42.07 kg/m   Physical Exam Vitals signs and nursing note reviewed.  Constitutional:      General: She is not in acute distress.    Appearance: She is well-developed. She is not diaphoretic.  HENT:     Head: Normocephalic and atraumatic.     Mouth/Throat:     Pharynx: No  oropharyngeal exudate.  Eyes:     General: No scleral icterus.       Right eye: No discharge.        Left eye: No discharge.     Conjunctiva/sclera: Conjunctivae normal.     Pupils: Pupils are equal, round, and reactive to light.  Neck:     Musculoskeletal: Normal range of motion and neck supple.     Thyroid: No thyromegaly.  Cardiovascular:     Rate and Rhythm: Normal rate and regular rhythm.     Heart sounds: Normal heart sounds. No murmur. No friction rub. No gallop.   Pulmonary:     Effort: Pulmonary effort is normal. No respiratory distress.     Breath sounds: Normal breath sounds. No stridor. No wheezing or rales.  Abdominal:     Palpations: Abdomen is soft.  Musculoskeletal:     Left ankle: She exhibits swelling. She exhibits normal pulse. Tenderness. No lateral malleolus, no medial malleolus, no head of 5th metatarsal and no proximal fibula tenderness found. Achilles tendon normal.        Feet:     Comments: No tenderness about the foot or knee; sensation intact to the foot, pulses intact, no bony tenderness  Lymphadenopathy:     Cervical: No cervical adenopathy.  Skin:    General: Skin is warm and dry.     Coloration: Skin is not pale.     Findings: No rash.  Neurological:     Mental Status: She is alert.     Coordination: Coordination normal.      ED Treatments / Results  Labs (all labs ordered are listed, but only abnormal results are displayed) Labs Reviewed - No data to display  EKG None  Radiology Dg Ankle Complete Left  Result Date: 02/08/2019 CLINICAL DATA:  Twisted ankle today, swelling and pain. EXAM: LEFT ANKLE COMPLETE - 3+ VIEW COMPARISON:  None. FINDINGS: Alignment is normal. Ankle mortise is symmetric. No fracture line or displaced fracture fragment appreciated. Visualized portions of the hindfoot and midfoot appear intact and normally aligned. Soft tissues about the LEFT ankle are unremarkable. IMPRESSION: Negative. Electronically Signed   By: Bary Richard M.D.   On: 02/08/2019 21:47    Procedures Procedures (including critical care time)  Medications Ordered in ED Medications  HYDROcodone-acetaminophen (NORCO/VICODIN) 5-325 MG per tablet 1 tablet (1 tablet Oral Given 02/08/19 2256)     Initial Impression / Assessment and Plan / ED Course  I have reviewed the triage vital signs and the nursing notes.  Pertinent labs & imaging results that were available during my care of the patient were reviewed by me and considered in my medical decision making (see chart for details).     Patient with suspected left ankle sprain.  X-rays negative.  Patient probably not steady enough to safely give crutches.  I will write for rolling knee scooter.  Patient placed in ASO.  Continue ibuprofen, ice, elevation.  Will give short course of Norco as ibuprofen was not improving the pain at home.  Patient recently finished a chronic prescription for oxycodone for  her back pain.  This was seen on review of the [place] narcotic database.  Will give follow-up to Dr. Pearletha Forge as needed.  Return precautions discussed.  Patient understands and agrees with plan.  Patient vital stable throughout ED course and discharged in satisfactory condition.  Final Clinical Impressions(s) / ED Diagnoses   Final diagnoses:  Acute left ankle pain    ED Discharge Orders  Ordered    HYDROcodone-acetaminophen (NORCO/VICODIN) 5-325 MG tablet  Every 6 hours PRN     02/08/19 2311           Emi Holes, PA-C 02/08/19 2324    Pricilla Loveless, MD 02/10/19 [phone]

## 2019-02-08 NOTE — ED Notes (Signed)
Pt and family understood dc material. NAD noted. Medication prescription sent electronically to pharmacy. Paper script given for DME. Work excuse given at [employer]. All questions answered to satisfaction.

## 2019-02-08 NOTE — ED Triage Notes (Signed)
Thursday states that her left ankle started bothering her. Friday it was a little worse. Pt was at her granddaughters birthday today and tripped over a rock and now the pain is unbearable and it hurts to bear weight. Inversion injury

## 2020-02-25 ENCOUNTER — Other Ambulatory Visit: Payer: Self-pay

## 2020-02-25 ENCOUNTER — Emergency Department (HOSPITAL_COMMUNITY)
Admission: EM | Admit: 2020-02-25 | Discharge: 2020-02-25 | Disposition: A | Discharge status: Home / Self Care | Payer: BC Managed Care – PPO | Attending: Emergency Medicine | Admitting: Emergency Medicine

## 2020-02-25 ENCOUNTER — Encounter (HOSPITAL_COMMUNITY): Payer: Self-pay

## 2020-02-25 DIAGNOSIS — H938X2 Other specified disorders of left ear: Secondary | ICD-10-CM

## 2020-02-25 DIAGNOSIS — F17210 Nicotine dependence, cigarettes, uncomplicated: Secondary | ICD-10-CM | POA: Insufficient documentation

## 2020-02-25 DIAGNOSIS — J45909 Unspecified asthma, uncomplicated: Secondary | ICD-10-CM | POA: Insufficient documentation

## 2020-02-25 DIAGNOSIS — R519 Headache, unspecified: Secondary | ICD-10-CM | POA: Insufficient documentation

## 2020-02-25 DIAGNOSIS — R110 Nausea: Secondary | ICD-10-CM | POA: Diagnosis not present

## 2020-02-25 DIAGNOSIS — R42 Dizziness and giddiness: Secondary | ICD-10-CM | POA: Diagnosis present

## 2020-02-25 HISTORY — DX: Gastro-esophageal reflux disease without esophagitis: K21.9

## 2020-02-25 LAB — COMPREHENSIVE METABOLIC PANEL
ALT: 17 U/L (ref 0–44)
AST: 16 U/L (ref 15–41)
Albumin: 4.2 g/dL (ref 3.5–5.0)
Alkaline Phosphatase: 79 U/L (ref 38–126)
Anion gap: 5 (ref 5–15)
BUN: 10 mg/dL (ref 6–20)
CO2: 28 mmol/L (ref 22–32)
Calcium: 9.1 mg/dL (ref 8.9–10.3)
Chloride: 104 mmol/L (ref 98–111)
Creatinine, Ser: 0.46 mg/dL (ref 0.44–1.00)
GFR calc Af Amer: >60 mL/min (ref >60)
GFR calc non Af Amer: >60 mL/min (ref >60)
Glucose, Bld: 94 mg/dL (ref 70–99)
Potassium: 3.8 mmol/L (ref 3.5–5.1)
Sodium: 137 mmol/L (ref 135–145)
Total Bilirubin: 0.4 mg/dL (ref 0.3–1.2)
Total Protein: 7.7 g/dL (ref 6.5–8.1)

## 2020-02-25 LAB — CBC WITH DIFFERENTIAL/PLATELET
Abs Immature Granulocytes: 0.03 10*3/uL (ref 0.00–0.07)
Basophils Absolute: 0.1 10*3/uL (ref 0.0–0.1)
Basophils Relative: 1 %
Eosinophils Absolute: 0.2 10*3/uL (ref 0.0–0.5)
Eosinophils Relative: 2 %
HCT: 44.6 % (ref 36.0–46.0)
Hemoglobin: 14.6 g/dL (ref 12.0–15.0)
Immature Granulocytes: 0 %
Lymphocytes Relative: 35 %
Lymphs Abs: 3.7 10*3/uL (ref 0.7–4.0)
MCH: 32 pg (ref 26.0–34.0)
MCHC: 32.7 g/dL (ref 30.0–36.0)
MCV: 97.8 fL (ref 80.0–100.0)
Monocytes Absolute: 0.6 10*3/uL (ref 0.1–1.0)
Monocytes Relative: 5 %
Neutro Abs: 6.1 10*3/uL (ref 1.7–7.7)
Neutrophils Relative %: 57 %
Platelets: 264 10*3/uL (ref 150–400)
RBC: 4.56 MIL/uL (ref 3.87–5.11)
RDW: 13.4 % (ref 11.5–15.5)
WBC: 10.7 10*3/uL — ABNORMAL HIGH (ref 4.0–10.5)
nRBC: 0 % (ref 0.0–0.2)

## 2020-02-25 MED ORDER — ONDANSETRON 4 MG PO TBDP: 4.0000 mg | ORAL_TABLET | Freq: Three times a day (TID) | ORAL | 0 refills | Status: DC | PRN

## 2020-02-25 MED ORDER — SODIUM CHLORIDE 0.9 % IV BOLUS
1000.0000 mL | Freq: Once | INTRAVENOUS | Status: AC
  Administered 2020-02-25: 1000 mL via INTRAVENOUS

## 2020-02-25 MED ORDER — MECLIZINE HCL 25 MG PO TABS: 25.0000 mg | ORAL_TABLET | Freq: Three times a day (TID) | ORAL | 0 refills | Status: DC | PRN

## 2020-02-25 MED ORDER — MECLIZINE HCL 25 MG PO TABS
25.0000 mg | ORAL_TABLET | Freq: Once | ORAL | Status: AC
  Administered 2020-02-25: 25 mg via ORAL
  Filled 2020-02-25: qty 1

## 2020-02-25 NOTE — ED Provider Notes (Signed)
[facility] DEPT Provider Note   CSN: [id number] Arrival date & time: 02/25/20  1125     History No chief complaint on file.   Wendy Lynn is a 54 y.o. female.  HPI  Patient is a 54 year old a past medical history significant for asthma and GERD as well as chronic low back pain post surgical intervention with right lower extremity neuropathy was been ongoing for years.  Patient presents today for sudden onset of vertiginous symptoms as well as some nausea.  Patient states she was sitting down when this occurred.  She states that it seems to be worse when she looks left.  She states has been constant since it has not changed in intensity.  She denies any weakness, sensation changes, vision loss or vision changes, double vision, blurry vision.  She denies any slurred speech or facial droop.  She denies any confusion or anxiety as well.  She states she has been drinking water normally but has not had an appetite because of her nausea.  She states that her symptoms began abruptly at 8 AM.  She endorses a sensation of the room spinning denies any lightheadedness or sensation she will pass out.  She does endorse a bandlike headache that has gradually onset around 9 AM.  She states her symptoms are worse with opening her eyes.  Patient denies any medication changes.  States that she takes Norco chronically for low back pain.  She states that she went to the clinic at work after her dizziness began and was told that her blood pressure was low but then was rechecked and she was told that it was high.     Past Medical History:  Diagnosis Date  . Asthma    AS A CHILD, GREW OUT OF IT AROUND 24 YR OF AGE  . GERD (gastroesophageal reflux disease)   . PONV (postoperative nausea and vomiting)    WITH PREVOUS BACK SURGERY.  Marland Kitchen Ulcer     Patient Active Problem List   Diagnosis Date Noted  . Lumbar foraminal stenosis 08/27/2018    Past Surgical History:  Procedure  Laterality Date  . ABDOMINAL HYSTERECTOMY    . BACK SURGERY    . ELBOW SURGERY     LEFT      OB History   No obstetric history on file.     Family History  Problem Relation Age of Onset  . Heart failure Father     Social History   Tobacco Use  . Smoking status: Current Every Day Smoker    Packs/day: 0.50    Years: 27.00    Pack years: 13.50    Types: Cigarettes  . Smokeless tobacco: Never Used  Substance Use Topics  . Alcohol use: No  . Drug use: No    Home Medications Prior to Admission medications   Medication Sig Start Date End Date Taking? Authorizing Provider  aspirin-acetaminophen-caffeine (EXCEDRIN MIGRAINE) [phone] MG tablet Take 2 tablets by mouth daily as needed for headache.    [provider]  cetirizine (ZYRTEC) 10 MG tablet Take 10 mg by mouth daily as needed for allergies.    [provider]  cyclobenzaprine (FLEXERIL) 10 MG tablet Take 1 tablet (10 mg total) by mouth 3 (three) times daily as needed for muscle spasms. 08/28/18   Earnie Larsson, MD  HYDROcodone-acetaminophen (NORCO/VICODIN) 5-325 MG tablet Take 1 tablet by mouth every 6 (six) hours as needed. 02/08/19   Law, Bea Graff, PA-C  meclizine (ANTIVERT) 25 MG  tablet Take 1 tablet (25 mg total) by mouth 3 (three) times daily as needed for dizziness. 02/25/20   Phila Shoaf S, PA  ondansetron (ZOFRAN ODT) 4 MG disintegrating tablet Take 1 tablet (4 mg total) by mouth every 8 (eight) hours as needed for nausea or vomiting. 02/25/20   Gailen Shelter, PA  oxyCODONE 10 MG TABS Take 0.5-1 tablets (5-10 mg total) by mouth every 3 (three) hours as needed ((score 7 to 10)). 08/28/18   Julio Sicks, MD    Allergies    Patient has no known allergies.  Review of Systems   Review of Systems  Constitutional: Negative for chills and fever.  HENT: Negative for congestion.   Eyes: Negative for pain.  Respiratory: Negative for cough and shortness of breath.   Cardiovascular: Negative for chest  pain and leg swelling.  Gastrointestinal: Positive for nausea. Negative for abdominal pain and vomiting.  Genitourinary: Negative for dysuria.  Musculoskeletal: Negative for myalgias.  Skin: Negative for rash.  Neurological: Positive for dizziness and headaches.    Physical Exam Updated Vital Signs BP 125/85 (BP Location: Right Arm)   Pulse (!) 58   Temp 98.1 F (36.7 C) (Oral)   Resp 15   Ht 5\' 2"  (1.575 m)   Wt 105.2 kg   SpO2 100%   BMI 42.43 kg/m   Physical Exam  ED Results / Procedures / Treatments   Labs (all labs ordered are listed, but only abnormal results are displayed) Labs Reviewed  CBC WITH DIFFERENTIAL/PLATELET - Abnormal; Notable for the following components:      Result Value   WBC 10.7 (*)    All other components within normal limits  COMPREHENSIVE METABOLIC PANEL    EKG EKG Interpretation  Date/Time:  Wednesday February 25 2020 13:26:05 EST Ventricular Rate:  57 PR Interval:    QRS Duration: 96 QT Interval:  451 QTC Calculation: 440 R Axis:   59 Text Interpretation: Sinus rhythm Borderline short PR interval Low voltage, precordial leads no significant change since 2015 Confirmed by 2016 [phone]) on 02/25/2020 1:48:11 PM   Radiology No results found.  Procedures Procedures (including critical care time)  Medications Ordered in ED Medications  sodium chloride 0.9 % bolus 1,000 mL (1,000 mLs Intravenous New Bag/Given 02/25/20 1325)  meclizine (ANTIVERT) tablet 25 mg (25 mg Oral Given 02/25/20 1326)    ED Course  I have reviewed the triage vital signs and the nursing notes.  Pertinent labs & imaging results that were available during my care of the patient were reviewed by me and considered in my medical decision making (see chart for details).    MDM Rules/Calculators/A&P                      Patient is 55 year old female presented today for sudden onset of dizziness that began 8 AM this morning.  She has a history of strokes and no  significant risk factors for stroke.  She has some mild ear discomfort of the left ear.  Denies any tenderness however.  He does unremarkable physical exam apart from some mild horizontal nystagmus.  It is in unidirectional and horizontal.  On exam she appears to have worsening symptoms with head movement however her symptoms do appear to be somewhat constant.  She states that she feels as when she closes her eyes and feels that this helps with the dizziness.  She has no dysmetria, has good coordination is able to ambulate without difficulty.  Concern for what is most likely peripheral vertigo.  Unlikely to be central cause as she is neurologically intact, has no significant stroke risk factors, and significantly improved after 1 L of normal saline and 25 mg of Antivert.  She states that she is no longer nauseous at all and not dizzy unless she moves quickly.  Shared decision-making conversation with patient.  I discussed my concern for posterior cerebellar stroke.  I discussed with patient that the only way to rule it out completely is to do a MRI of the brain.  As she feels better this time she would prefer to be discharged with meclizine and will follow up with ENT if needed for vestibular rehab.  Patient is tolerating p.o. without difficulty.  She is ambulating in ED.  She is well-appearing and vital signs within normal limits.  Her neuro exam continues to be benign.  Will discharge at this time with strict return precautions and close follow-up.    ---------  I discussed this case with my attending physician who cosigned this note including patient's presenting symptoms, physical exam, and planned diagnostics and interventions. Attending physician stated agreement with plan or made changes to plan which were implemented.   Attending physician assessed patient at bedside. The medical records were personally reviewed by myself. I personally reviewed all lab results and interpreted all imaging  studies and either concurred with their official read or contacted radiology for clarification.   This patient appears reasonably screened and I doubt any other medical condition requiring further workup, evaluation, or treatment in the ED at this time prior to discharge.   Patient's vitals are WNL apart from vital sign abnormalities discussed above, patient is in NAD, and able to ambulate in the ED at their baseline and able to tolerate PO.  Pain has been managed or a plan has been made for home management and has no complaints prior to discharge. Patient is comfortable with above plan and for discharge at this time. All questions were answered prior to disposition. Results from the ER workup discussed with the patient face to face and all questions answered to the best of my ability. The patient is safe for discharge with strict return precautions. Patient appears safe for discharge with appropriate follow-up. Conveyed my impression with the patient and they voiced understanding and are agreeable to plan.   An After Visit Summary was printed and given to the patient.  Portions of this note were generated with Scientist, clinical (histocompatibility and immunogenetics). Dictation errors may occur despite best attempts at proofreading.    Final Clinical Impression(s) / ED Diagnoses Final diagnoses:  Vertigo  Nausea  Ear fullness, left    Rx / DC Orders ED Discharge Orders         Ordered    meclizine (ANTIVERT) 25 MG tablet  3 times daily PRN     02/25/20 1428    ondansetron (ZOFRAN ODT) 4 MG disintegrating tablet  Every 8 hours PRN     02/25/20 1428           Blanchie Dessert Sophia, [place] 02/25/20 2151    Pricilla Loveless, MD 02/28/20 [phone]

## 2020-02-25 NOTE — Discharge Instructions (Signed)
Please take meclizine as prescribed.  You have any new or concerning symptoms such as the ones what we discussed please return immediately to ED.  These include slurred speech, difficulty speaking, confusion, numbness or weakness of any of her limbs or areas in your body.  Or any other new or concerning symptoms.  Please use Zofran as needed for nausea.  Please drink plenty of fluids.  Please read the attached formation on labyrinthitis/vestibular neuritis which I suspect is causing your symptoms today.

## 2020-02-25 NOTE — ED Triage Notes (Signed)
Patient c/o dizziness since 0800 today. Patient states she has to keep her eyes closed to keep from "spinning". Patient reports a headache since 0900 today. Patient states she went to the clinic at her work and was told her BP was high then low.

## 2021-05-08 ENCOUNTER — Emergency Department (HOSPITAL_BASED_OUTPATIENT_CLINIC_OR_DEPARTMENT_OTHER)
Admission: EM | Admit: 2021-05-08 | Discharge: 2021-05-08 | Disposition: A | Discharge status: Home / Self Care | Payer: BC Managed Care – PPO | Attending: Emergency Medicine | Admitting: Emergency Medicine

## 2021-05-08 ENCOUNTER — Encounter (HOSPITAL_BASED_OUTPATIENT_CLINIC_OR_DEPARTMENT_OTHER): Payer: Self-pay | Admitting: Emergency Medicine

## 2021-05-08 ENCOUNTER — Other Ambulatory Visit: Payer: Self-pay

## 2021-05-08 DIAGNOSIS — F17210 Nicotine dependence, cigarettes, uncomplicated: Secondary | ICD-10-CM | POA: Insufficient documentation

## 2021-05-08 DIAGNOSIS — M545 Low back pain, unspecified: Secondary | ICD-10-CM | POA: Diagnosis not present

## 2021-05-08 DIAGNOSIS — J45909 Unspecified asthma, uncomplicated: Secondary | ICD-10-CM | POA: Diagnosis not present

## 2021-05-08 DIAGNOSIS — Z7982 Long term (current) use of aspirin: Secondary | ICD-10-CM | POA: Diagnosis not present

## 2021-05-08 LAB — URINALYSIS, ROUTINE W REFLEX MICROSCOPIC
Bilirubin Urine: NEGATIVE
Glucose, UA: NEGATIVE mg/dL
Hgb urine dipstick: NEGATIVE
Ketones, ur: NEGATIVE mg/dL
Leukocytes,Ua: NEGATIVE
Nitrite: NEGATIVE
Protein, ur: NEGATIVE mg/dL
Specific Gravity, Urine: 1.01 (ref 1.005–1.030)
pH: 6.5 (ref 5.0–8.0)

## 2021-05-08 MED ORDER — METHOCARBAMOL 500 MG PO TABS: 500.0000 mg | ORAL_TABLET | Freq: Two times a day (BID) | ORAL | 0 refills | Status: DC

## 2021-05-08 MED ORDER — NAPROXEN 375 MG PO TABS: 375.0000 mg | ORAL_TABLET | Freq: Two times a day (BID) | ORAL | 0 refills | Status: DC

## 2021-05-08 MED ORDER — METHOCARBAMOL 500 MG PO TABS
1000.0000 mg | ORAL_TABLET | Freq: Once | ORAL | Status: AC
  Administered 2021-05-08: 1000 mg via ORAL
  Filled 2021-05-08: qty 2

## 2021-05-08 MED ORDER — KETOROLAC TROMETHAMINE 60 MG/2ML IM SOLN
60.0000 mg | Freq: Once | INTRAMUSCULAR | Status: AC
  Administered 2021-05-08: 60 mg via INTRAMUSCULAR
  Filled 2021-05-08: qty 2

## 2021-05-08 NOTE — ED Triage Notes (Signed)
Pt arrives to ED with c/o of right lower back pain. The pain started yesterday acutely at 2pm. The pain waxes and wanes. Pain is sharp. Pt denies dysuria but states possible bladder pain/dullness. Hx sciatica and lower back surgeries.

## 2021-05-08 NOTE — ED Provider Notes (Signed)
[facility] EMERGENCY DEPARTMENT Provider Note  CSN: [id number] Arrival date & time: 05/08/21 2979    History Chief Complaint  Patient presents with  . Back Pain    HPI  Wendy Lynn is a 55 y.o. female with history of chronic back pain, followed by Neurosurgery reports yesterday while sitting on the couch, she had sudden onset of R lower back pain, moderate to severe, aching, non radiating, worse with certain movements. Not associated with any urinary symptoms. Not improved with her usual home pain medications.    Past Medical History:  Diagnosis Date  . Asthma    AS A CHILD, GREW OUT OF IT AROUND 24 YR OF AGE  . GERD (gastroesophageal reflux disease)   . PONV (postoperative nausea and vomiting)    WITH PREVOUS BACK SURGERY.  Marland Kitchen Ulcer     Past Surgical History:  Procedure Laterality Date  . ABDOMINAL HYSTERECTOMY    . BACK SURGERY    . ELBOW SURGERY     LEFT     Family History  Problem Relation Age of Onset  . Heart failure Father     Social History   Tobacco Use  . Smoking status: Current Every Day Smoker    Packs/day: 0.50    Years: 27.00    Pack years: 13.50    Types: Cigarettes  . Smokeless tobacco: Never Used  Vaping Use  . Vaping Use: Never used  Substance Use Topics  . Alcohol use: No  . Drug use: No     Home Medications Prior to Admission medications   Medication Sig Start Date End Date Taking? Authorizing Provider  cetirizine (ZYRTEC) 10 MG tablet Take 10 mg by mouth daily as needed for allergies.   Yes [provider]  methocarbamol (ROBAXIN) 500 MG tablet Take 1 tablet (500 mg total) by mouth 2 (two) times daily. 05/08/21  Yes Pollyann Savoy, MD  naproxen (NAPROSYN) 375 MG tablet Take 1 tablet (375 mg total) by mouth 2 (two) times daily with a meal. 05/08/21  Yes Pollyann Savoy, MD  oxyCODONE 10 MG TABS Take 0.5-1 tablets (5-10 mg total) by mouth every 3 (three) hours as needed ((score 7 to 10)). 08/28/18  Yes Pool,  Sherilyn Cooter, MD  varenicline (CHANTIX) 1 MG tablet Take 1 mg by mouth 2 (two) times daily. 02/05/21  Yes [provider]  aspirin-acetaminophen-caffeine (EXCEDRIN MIGRAINE) [phone] MG tablet Take 2 tablets by mouth daily as needed for headache.    [provider]     Allergies    Patient has no known allergies.   Review of Systems   Review of Systems A comprehensive review of systems was completed and negative except as noted in HPI.    Physical Exam BP (!) 146/89 (BP Location: Right Arm)   Pulse 71   Temp 98.3 F (36.8 C) (Oral)   Resp 18   Ht 5\' 2"  (1.575 m)   Wt 99.8 kg   SpO2 99%   BMI 40.24 kg/m   Physical Exam Vitals and nursing note reviewed.  Constitutional:      Appearance: Normal appearance.  HENT:     Head: Normocephalic and atraumatic.     Nose: Nose normal.     Mouth/Throat:     Mouth: Mucous membranes are moist.  Eyes:     Extraocular Movements: Extraocular movements intact.     Conjunctiva/sclera: Conjunctivae normal.  Cardiovascular:     Rate and Rhythm: Normal rate.  Pulmonary:  Effort: Pulmonary effort is normal.     Breath sounds: Normal breath sounds.  Abdominal:     General: Abdomen is flat.     Palpations: Abdomen is soft.     Tenderness: There is no abdominal tenderness.  Musculoskeletal:        General: Tenderness (R lumbar paraspinal muscles) present. No swelling. Normal range of motion.     Cervical back: Neck supple.  Skin:    General: Skin is warm and dry.  Neurological:     General: No focal deficit present.     Mental Status: She is alert.  Psychiatric:        Mood and Affect: Mood normal.      ED Results / Procedures / Treatments   Labs (all labs ordered are listed, but only abnormal results are displayed) Labs Reviewed  URINALYSIS, ROUTINE W REFLEX MICROSCOPIC    EKG None   Radiology No results found.  Procedures Procedures  Medications Ordered in the ED Medications  ketorolac (TORADOL)  injection 60 mg (60 mg Intramuscular Given 05/08/21 0938)  methocarbamol (ROBAXIN) tablet 1,000 mg (1,000 mg Oral Given 05/08/21 2330)     MDM Rules/Calculators/A&P MDM Patient with MSK back pain, denies urinary symptoms to me, reported "bladder fullness" to RN. No red flags for acute surgical process. Will give Toradol/Robaxin for pain. Check UA.  ED Course  I have reviewed the triage vital signs and the nursing notes.  Pertinent labs & imaging results that were available during my care of the patient were reviewed by me and considered in my medical decision making (see chart for details).  Clinical Course as of 05/08/21 1026  Sun May 08, 2021  0762 UA is normal.  [CS]  1023 Pain is improved. Plan d/c with Rx for Naprosyn and Robaxin. Spine follow up if not improving.  [CS]    Clinical Course User Index [CS] Pollyann Savoy, MD    Final Clinical Impression(s) / ED Diagnoses Final diagnoses:  Acute right-sided low back pain without sciatica    Rx / DC Orders ED Discharge Orders         Ordered    naproxen (NAPROSYN) 375 MG tablet  2 times daily with meals        05/08/21 1026    methocarbamol (ROBAXIN) 500 MG tablet  2 times daily        05/08/21 1026           Pollyann Savoy, MD 05/08/21 1026

## 2021-05-08 NOTE — ED Notes (Signed)
Patient verbalizes understanding of discharge instructions. Opportunity for questioning and answers were provided. Armband removed by staff, pt discharged from ED.  

## 2021-05-16 ENCOUNTER — Encounter (HOSPITAL_BASED_OUTPATIENT_CLINIC_OR_DEPARTMENT_OTHER): Payer: Self-pay | Admitting: Emergency Medicine

## 2021-05-16 ENCOUNTER — Emergency Department (HOSPITAL_BASED_OUTPATIENT_CLINIC_OR_DEPARTMENT_OTHER): Payer: BC Managed Care – PPO | Admitting: Radiology

## 2021-05-16 ENCOUNTER — Emergency Department (HOSPITAL_BASED_OUTPATIENT_CLINIC_OR_DEPARTMENT_OTHER)
Admission: EM | Admit: 2021-05-16 | Discharge: 2021-05-16 | Disposition: A | Discharge status: Home / Self Care | Payer: BC Managed Care – PPO | Attending: Emergency Medicine | Admitting: Emergency Medicine

## 2021-05-16 ENCOUNTER — Other Ambulatory Visit: Payer: Self-pay

## 2021-05-16 DIAGNOSIS — M545 Low back pain, unspecified: Secondary | ICD-10-CM | POA: Diagnosis present

## 2021-05-16 DIAGNOSIS — F17210 Nicotine dependence, cigarettes, uncomplicated: Secondary | ICD-10-CM | POA: Diagnosis not present

## 2021-05-16 DIAGNOSIS — M5441 Lumbago with sciatica, right side: Secondary | ICD-10-CM

## 2021-05-16 DIAGNOSIS — J45909 Unspecified asthma, uncomplicated: Secondary | ICD-10-CM | POA: Diagnosis not present

## 2021-05-16 MED ORDER — HYDROMORPHONE HCL 1 MG/ML IJ SOLN
2.0000 mg | Freq: Once | INTRAMUSCULAR | Status: AC
  Administered 2021-05-16: 2 mg via INTRAMUSCULAR
  Filled 2021-05-16: qty 2

## 2021-05-16 MED ORDER — LIDOCAINE 5 % EX PTCH: 1.0000 | MEDICATED_PATCH | CUTANEOUS | 0 refills | Status: DC

## 2021-05-16 MED ORDER — KETOROLAC TROMETHAMINE 30 MG/ML IJ SOLN
30.0000 mg | Freq: Once | INTRAMUSCULAR | Status: AC
  Administered 2021-05-16: 30 mg via INTRAMUSCULAR
  Filled 2021-05-16: qty 1

## 2021-05-16 NOTE — ED Notes (Signed)
Patient verbalizes understanding of discharge instructions. Opportunity for questioning and answers were provided. Armband removed by staff, pt discharged from ED.  

## 2021-05-16 NOTE — ED Notes (Signed)
Pt bladder scanned. Nothing in bladder (0 ml). Pt stated that she felt like she got everything out.

## 2021-05-16 NOTE — Discharge Instructions (Signed)
If you develop worsening, recurrent, or continued back pain, numbness or weakness in the legs, incontinence of your bowels or bladders, numbness of your buttocks, fever, abdominal pain, or any other new/concerning symptoms then return to the ER for evaluation.  

## 2021-05-16 NOTE — ED Provider Notes (Signed)
[facility] EMERGENCY DEPT Provider Note   CSN: [id number] Arrival date & time: 05/16/21  1514     History Chief Complaint  Patient presents with  . Back Pain    Wendy Lynn is a 55 y.o. female.  HPI 55 year old female presents with severe low back pain.  Originally started 8 days ago, was seen here and then that seemed to improve but now is having diffuse low back pain starting about 5 days ago.  This pain radiates down her right leg on the posterior aspect.  No numbness besides the chronic lower leg numbness in the right leg.  However she is having severe pain in her right leg.  There is no weakness.  A couple times she has peed on herself though she otherwise has no trouble urinating.  No saddle anesthesia.  Has been taking oxycodone, muscle relaxer and IcyHot with no relief. No fevers.  Past Medical History:  Diagnosis Date  . Asthma    AS A CHILD, GREW OUT OF IT AROUND 24 YR OF AGE  . GERD (gastroesophageal reflux disease)   . PONV (postoperative nausea and vomiting)    WITH PREVOUS BACK SURGERY.  Marland Kitchen Ulcer     Patient Active Problem List   Diagnosis Date Noted  . Lumbar foraminal stenosis 08/27/2018    Past Surgical History:  Procedure Laterality Date  . ABDOMINAL HYSTERECTOMY    . BACK SURGERY    . ELBOW SURGERY     LEFT      OB History   No obstetric history on file.     Family History  Problem Relation Age of Onset  . Heart failure Father     Social History   Tobacco Use  . Smoking status: Current Every Day Smoker    Packs/day: 0.50    Years: 27.00    Pack years: 13.50    Types: Cigarettes  . Smokeless tobacco: Never Used  Vaping Use  . Vaping Use: Never used  Substance Use Topics  . Alcohol use: No  . Drug use: No    Home Medications Prior to Admission medications   Medication Sig Start Date End Date Taking? Authorizing Provider  lidocaine (LIDODERM) 5 % Place 1 patch onto the skin daily. Remove & Discard patch within 12  hours or as directed by MD 05/16/21  Yes Pricilla Loveless, MD  aspirin-acetaminophen-caffeine (EXCEDRIN MIGRAINE) [phone] MG tablet Take 2 tablets by mouth daily as needed for headache.    [provider]  cetirizine (ZYRTEC) 10 MG tablet Take 10 mg by mouth daily as needed for allergies.    [provider]  methocarbamol (ROBAXIN) 500 MG tablet Take 1 tablet (500 mg total) by mouth 2 (two) times daily. 05/08/21   Pollyann Savoy, MD  naproxen (NAPROSYN) 375 MG tablet Take 1 tablet (375 mg total) by mouth 2 (two) times daily with a meal. 05/08/21   Pollyann Savoy, MD  oxyCODONE 10 MG TABS Take 0.5-1 tablets (5-10 mg total) by mouth every 3 (three) hours as needed ((score 7 to 10)). 08/28/18   Julio Sicks, MD  varenicline (CHANTIX) 1 MG tablet Take 1 mg by mouth 2 (two) times daily. 02/05/21   [provider]    Allergies    Patient has no known allergies.  Review of Systems   Review of Systems  Constitutional: Negative for fever.  Gastrointestinal: Negative for abdominal pain.  Musculoskeletal: Positive for back pain.  Neurological: Negative for weakness and numbness.  All other  systems reviewed and are negative.   Physical Exam Updated Vital Signs BP (!) 147/86   Pulse (!) 59   Temp 98.1 F (36.7 C) (Oral)   Resp 20   Ht 5\' 2"  (1.575 m)   Wt 99.8 kg   SpO2 100%   BMI 40.24 kg/m   Physical Exam Vitals and nursing note reviewed.  Constitutional:      Appearance: She is well-developed. She is obese. She is not ill-appearing or diaphoretic.  HENT:     Head: Normocephalic and atraumatic.     Right Ear: External ear normal.     Left Ear: External ear normal.     Nose: Nose normal.  Eyes:     General:        Right eye: No discharge.        Left eye: No discharge.  Cardiovascular:     Rate and Rhythm: Normal rate and regular rhythm.     Heart sounds: Normal heart sounds.  Pulmonary:     Effort: Pulmonary effort is normal.     Breath sounds:  Normal breath sounds.  Abdominal:     Palpations: Abdomen is soft.     Tenderness: There is no abdominal tenderness.  Musculoskeletal:     Thoracic back: No tenderness.     Lumbar back: No tenderness.       Back:  Skin:    General: Skin is warm and dry.  Neurological:     Mental Status: She is alert.     Comments: Normal sensation in BLE. downgoing babinski. 5/5 strength LLE, 4/5 strength RLE. Both are limited by pain, especially the right  Psychiatric:        Mood and Affect: Mood is not anxious.     ED Results / Procedures / Treatments   Labs (all labs ordered are listed, but only abnormal results are displayed) Labs Reviewed - No data to display  EKG None  Radiology DG Lumbar Spine Complete  Result Date: 05/16/2021 CLINICAL DATA:  Low back pain, began Friday, history of prior surgery the EXAM: LUMBAR SPINE - COMPLETE 4+ VIEW COMPARISON:  04/29/2020 FINDINGS: Five normally formed lumbar levels. Postsurgical changes from prior L5-S1 posterior lumbar and interbody fusion without acute or worrisome postsurgical complication. No evidence of hardware fracture or failure. No acute vertebral body fracture or height loss. No traumatic listhesis. Background of multilevel discogenic and facet degenerative changes. Mild degenerative changes in the hips and pelvis as well. Atherosclerotic calcification of the aorta. Remaining soft tissues are free of acute or significant abnormality. IMPRESSION: No acute or conspicuous osseous abnormalities. Prior L5-S1 fusion without acute complication. Electronically Signed   By: 06/29/2020 M.D.   On: 05/16/2021 17:49    Procedures Procedures   Medications Ordered in ED Medications  HYDROmorphone (DILAUDID) injection 2 mg (2 mg Intramuscular Given 05/16/21 1733)  ketorolac (TORADOL) 30 MG/ML injection 30 mg (30 mg Intramuscular Given 05/16/21 1733)    ED Course  I have reviewed the triage vital signs and the nursing notes.  Pertinent labs &  imaging results that were available during my care of the patient were reviewed by me and considered in my medical decision making (see chart for details).    MDM Rules/Calculators/A&P                          Patient's neuro exam is improved with pain control.  She is freely moving her right lower extremity.  Able  to walk to the bathroom.  Initial bladder scan had 200+ cc but she had not urinated and after urinating her post residual is 0.  While she describes some possible incontinence, with no saddle anesthesia I think this is unlikely to be acute cauda equina and do not think emergent MRI is needed.  We will add on Lidoderm and she is already on oxycodone and muscle relaxers.  Have her follow-up with her neurosurgeon on Thursday as scheduled. Final Clinical Impression(s) / ED Diagnoses Final diagnoses:  Acute bilateral low back pain with right-sided sciatica    Rx / DC Orders ED Discharge Orders         Ordered    lidocaine (LIDODERM) 5 %  Every 24 hours        05/16/21 1826           Pricilla Loveless, MD 05/16/21 1827

## 2021-05-16 NOTE — ED Triage Notes (Signed)
Pt arrives to ED with c/o of acute on chronic back pain that started on Friday. Pain is described as a constant ache/sharp pain. The pain radiates down the right leg. Not associated with urinary symptoms. Pt states robaxin and nap[roxen are not providing relief.

## 2021-05-19 ENCOUNTER — Other Ambulatory Visit (HOSPITAL_COMMUNITY): Payer: Self-pay | Admitting: Student

## 2021-05-19 DIAGNOSIS — M5416 Radiculopathy, lumbar region: Secondary | ICD-10-CM

## 2021-05-20 ENCOUNTER — Ambulatory Visit (HOSPITAL_COMMUNITY)
Admission: RE | Admit: 2021-05-20 | Discharge: 2021-05-20 | Disposition: A | Discharge status: Home / Self Care | Payer: BC Managed Care – PPO | Source: Ambulatory Visit | Attending: Student | Admitting: Student

## 2021-05-20 ENCOUNTER — Other Ambulatory Visit: Payer: Self-pay

## 2021-05-20 DIAGNOSIS — M5416 Radiculopathy, lumbar region: Secondary | ICD-10-CM | POA: Diagnosis present

## 2021-05-20 MED ORDER — GADOBUTROL 1 MMOL/ML IV SOLN
10.0000 mL | Freq: Once | INTRAVENOUS | Status: AC | PRN
  Administered 2021-05-20: 10 mL via INTRAVENOUS

## 2022-09-08 HISTORY — PX: SACRAL NERVE STIMULATOR PLACEMENT: SHX2367

## 2023-01-03 ENCOUNTER — Other Ambulatory Visit: Payer: Self-pay

## 2023-01-03 ENCOUNTER — Emergency Department (HOSPITAL_BASED_OUTPATIENT_CLINIC_OR_DEPARTMENT_OTHER)
Admission: EM | Admit: 2023-01-03 | Discharge: 2023-01-03 | Disposition: A | Discharge status: Home / Self Care | Payer: No Typology Code available for payment source | Attending: Emergency Medicine | Admitting: Emergency Medicine

## 2023-01-03 ENCOUNTER — Encounter (HOSPITAL_BASED_OUTPATIENT_CLINIC_OR_DEPARTMENT_OTHER): Payer: Self-pay

## 2023-01-03 ENCOUNTER — Emergency Department (HOSPITAL_BASED_OUTPATIENT_CLINIC_OR_DEPARTMENT_OTHER): Payer: No Typology Code available for payment source | Admitting: Radiology

## 2023-01-03 ENCOUNTER — Emergency Department (HOSPITAL_BASED_OUTPATIENT_CLINIC_OR_DEPARTMENT_OTHER): Payer: No Typology Code available for payment source

## 2023-01-03 DIAGNOSIS — R079 Chest pain, unspecified: Secondary | ICD-10-CM | POA: Diagnosis present

## 2023-01-03 DIAGNOSIS — Z7982 Long term (current) use of aspirin: Secondary | ICD-10-CM | POA: Insufficient documentation

## 2023-01-03 DIAGNOSIS — K449 Diaphragmatic hernia without obstruction or gangrene: Secondary | ICD-10-CM | POA: Insufficient documentation

## 2023-01-03 LAB — BASIC METABOLIC PANEL
Anion gap: 10 (ref 5–15)
BUN: 14 mg/dL (ref 6–20)
CO2: 28 mmol/L (ref 22–32)
Calcium: 9.9 mg/dL (ref 8.9–10.3)
Chloride: 102 mmol/L (ref 98–111)
Creatinine, Ser: 0.74 mg/dL (ref 0.44–1.00)
GFR, Estimated: >60 mL/min (ref >60)
Glucose, Bld: 120 mg/dL — ABNORMAL HIGH (ref 70–99)
Potassium: 4.2 mmol/L (ref 3.5–5.1)
Sodium: 140 mmol/L (ref 135–145)

## 2023-01-03 LAB — CBC
HCT: 39.3 % (ref 36.0–46.0)
Hemoglobin: 13 g/dL (ref 12.0–15.0)
MCH: 32.2 pg (ref 26.0–34.0)
MCHC: 33.1 g/dL (ref 30.0–36.0)
MCV: 97.3 fL (ref 80.0–100.0)
Platelets: 266 10*3/uL (ref 150–400)
RBC: 4.04 MIL/uL (ref 3.87–5.11)
RDW: 12.5 % (ref 11.5–15.5)
WBC: 8.3 10*3/uL (ref 4.0–10.5)
nRBC: 0 % (ref 0.0–0.2)

## 2023-01-03 LAB — TROPONIN I (HIGH SENSITIVITY)
Troponin I (High Sensitivity): 2 ng/L (ref <18)
Troponin I (High Sensitivity): 3 ng/L (ref <18)

## 2023-01-03 MED ORDER — MORPHINE SULFATE (PF) 4 MG/ML IV SOLN
4.0000 mg | Freq: Once | INTRAVENOUS | Status: AC
  Administered 2023-01-03: 4 mg via INTRAVENOUS
  Filled 2023-01-03: qty 1

## 2023-01-03 MED ORDER — PANTOPRAZOLE SODIUM 40 MG IV SOLR
40.0000 mg | Freq: Once | INTRAVENOUS | Status: AC
  Administered 2023-01-03: 40 mg via INTRAVENOUS
  Filled 2023-01-03: qty 10

## 2023-01-03 MED ORDER — PANTOPRAZOLE SODIUM 20 MG PO TBEC: 20.0000 mg | DELAYED_RELEASE_TABLET | Freq: Two times a day (BID) | ORAL | 1 refills | Status: DC

## 2023-01-03 MED ORDER — IOHEXOL 350 MG/ML SOLN
100.0000 mL | Freq: Once | INTRAVENOUS | Status: AC | PRN
  Administered 2023-01-03: 80 mL via INTRAVENOUS

## 2023-01-03 NOTE — ED Provider Notes (Signed)
[facility] EMERGENCY DEPT Provider Note   CSN: [id number] Arrival date & time: 01/03/23  4098     History  Chief Complaint  Patient presents with   Chest Pain    Wendy Lynn is a 57 y.o. female.   Chest Pain    Pt started having chest pain around 1230.  Pt thought it was indegestion, she tried antacids without relief.  The pain goes from the front to the back.  THe pain is decreasing some.  SHe did vomit earlier.  Some shortness of breath with this.  No history of heart or lung disease.  Quit smoking 6 months ago.  Home Medications Prior to Admission medications   Medication Sig Start Date End Date Taking? Authorizing Provider  pantoprazole (PROTONIX) 20 MG tablet Take 1 tablet (20 mg total) by mouth 2 (two) times daily for 7 days. 01/03/23 01/10/23 Yes Dorie Rank, MD  aspirin-acetaminophen-caffeine (EXCEDRIN MIGRAINE) [phone] MG tablet Take 2 tablets by mouth daily as needed for headache.    [provider]  cetirizine (ZYRTEC) 10 MG tablet Take 10 mg by mouth daily as needed for allergies.    [provider]  lidocaine (LIDODERM) 5 % Place 1 patch onto the skin daily. Remove & Discard patch within 12 hours or as directed by MD 05/16/21   Sherwood Gambler, MD  methocarbamol (ROBAXIN) 500 MG tablet Take 1 tablet (500 mg total) by mouth 2 (two) times daily. 05/08/21   Truddie Hidden, MD  naproxen (NAPROSYN) 375 MG tablet Take 1 tablet (375 mg total) by mouth 2 (two) times daily with a meal. 05/08/21   Truddie Hidden, MD  oxyCODONE 10 MG TABS Take 0.5-1 tablets (5-10 mg total) by mouth every 3 (three) hours as needed ((score 7 to 10)). 08/28/18   Earnie Larsson, MD  varenicline (CHANTIX) 1 MG tablet Take 1 mg by mouth 2 (two) times daily. 02/05/21   [provider]      Allergies    Patient has no known allergies.    Review of Systems   Review of Systems  Cardiovascular:  Positive for chest pain.    Physical Exam Updated Vital  Signs BP 135/77   Pulse (!) 57   Temp 98.6 F (37 C) (Oral)   Resp 11   Ht 1.575 m (5\' 2" )   Wt 99.8 kg   SpO2 100%   BMI 40.24 kg/m  Physical Exam Vitals and nursing note reviewed.  Constitutional:      General: She is not in acute distress.    Appearance: She is well-developed.  HENT:     Head: Normocephalic and atraumatic.     Right Ear: External ear normal.     Left Ear: External ear normal.  Eyes:     General: No scleral icterus.       Right eye: No discharge.        Left eye: No discharge.     Conjunctiva/sclera: Conjunctivae normal.  Neck:     Trachea: No tracheal deviation.  Cardiovascular:     Rate and Rhythm: Normal rate and regular rhythm.  Pulmonary:     Effort: Pulmonary effort is normal. No respiratory distress.     Breath sounds: Normal breath sounds. No stridor. No wheezing or rales.  Abdominal:     General: Bowel sounds are normal. There is no distension.     Palpations: Abdomen is soft.     Tenderness: There is no abdominal tenderness. There is no guarding or  rebound.  Musculoskeletal:        General: No tenderness or deformity.     Cervical back: Neck supple.  Skin:    General: Skin is warm and dry.     Findings: No rash.  Neurological:     General: No focal deficit present.     Mental Status: She is alert.     Cranial Nerves: No cranial nerve deficit, dysarthria or facial asymmetry.     Sensory: No sensory deficit.     Motor: No abnormal muscle tone or seizure activity.     Coordination: Coordination normal.  Psychiatric:        Mood and Affect: Mood normal.     ED Results / Procedures / Treatments   Labs (all labs ordered are listed, but only abnormal results are displayed) Labs Reviewed  BASIC METABOLIC PANEL - Abnormal; Notable for the following components:      Result Value   Glucose, Bld 120 (*)    All other components within normal limits  CBC  TROPONIN I (HIGH SENSITIVITY)  TROPONIN I (HIGH SENSITIVITY)     EKG None  Radiology CT Angio Chest Aorta W and/or Wo Contrast  Result Date: 01/03/2023 CLINICAL DATA:  Acute aortic syndrome suspected. Complains of mid chest pain, nausea and vomiting. Pain radiating to the back. EXAM: CT ANGIOGRAPHY CHEST WITH CONTRAST TECHNIQUE: Multidetector CT imaging of the chest was performed using the standard protocol during bolus administration of intravenous contrast. Multiplanar CT image reconstructions and MIPs were obtained to evaluate the vascular anatomy. RADIATION DOSE REDUCTION: This exam was performed according to the departmental dose-optimization program which includes automated exposure control, adjustment of the mA and/or kV according to patient size and/or use of iterative reconstruction technique. CONTRAST:  33mL OMNIPAQUE IOHEXOL 350 MG/ML SOLN COMPARISON:  PA and lateral chest today chest radiographs 06/10/2019, and a lumbar spine CT myelogram 05/10/2018. FINDINGS: Cardiovascular: The cardiac size is normal. There is no pericardial effusion. No arterial dilatation or embolus is seen. The pulmonary veins are decompressed. The aorta and great vessels are normal in course and caliber. There is a small amount of scattered soft plaque along the wall of the descending thoracic aorta. No other aortic atherosclerosis is seen. There is no aneurysm, dissection or stenosis, no penetrating ulcer. There are trace scattered calcifications in the coronary arteries. Mediastinum/Nodes: No enlarged mediastinal, hilar, or axillary lymph nodes. Thyroid gland, trachea, and esophagus demonstrate no significant findings. There is a small hiatal hernia. There are few borderline sized bilateral hilar nodes up to 8 mm short axis but they do not exceed the pathologic size criteria. Lungs/Pleura: There is asymmetric eventration and elevation of right hemidiaphragm. There is no pleural effusion, thickening or pneumothorax. The main bronchi are patent. There is diffuse bronchial thickening.  No bronchial plugging is seen. No focal infiltrates or pulmonary nodules are seen. Upper Abdomen: As seen on the May 2019 lumbar spine CT myelogram, there is a stable 1.6 cm right adrenal adenoma and stable 1.9 cm left adrenal adenoma. No acute upper abdominal abnormality is seen. There is moderate chronic pancreatic atrophy. Mild hepatic steatosis. Musculoskeletal: Spinal cord stimulator wiring entering from inferiorly terminates at the level of T6-7 dorsally. There is thoracic spondylosis. No destructive bone lesion is seen. Review of the MIP images confirms the above findings. IMPRESSION: 1. No evidence of arterial dilatation or embolus. 2. Aortic and scattered trace coronary artery atherosclerosis. No aortic aneurysm, dissection or penetrating ulcer. Scattered soft plaque along the wall of the  descending aorta. 3. Diffuse bronchial thickening consistent with bronchitis or reactive airways disease. No focal infiltrate. 4. Small hiatal hernia. 5. Stable bilateral adrenal adenomas. 6. Hepatic steatosis. 7. Chronic pancreatic atrophy. 8. 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 9. Borderline sized hilar nodes . Electronically Signed   By: Almira Bar M.D.   On: 01/03/2023 22:45   DG Chest 2 View  Result Date: 01/03/2023 CLINICAL DATA:  Chest pain. EXAM: CHEST - 2 VIEW COMPARISON:  Chest radiograph dated June 10, 2019 FINDINGS: The heart size and mediastinal contours are within normal limits. Both lungs are clear. The visualized skeletal structures are unremarkable. Spinal pain stimulator device over the midthoracic spine. IMPRESSION: No active cardiopulmonary disease. Electronically Signed   By: Larose Hires D.O.   On: 01/03/2023 20:21    Procedures Procedures    Medications Ordered in ED Medications  pantoprazole (PROTONIX) injection 40 mg (40 mg Intravenous Given 01/03/23 2256)  morphine (PF) 4 MG/ML injection 4 mg (4 mg Intravenous Given 01/03/23 2256)  iohexol  (OMNIPAQUE) 350 MG/ML injection 100 mL (80 mLs Intravenous Contrast Given 01/03/23 2207)    ED Course/ Medical Decision Making/ A&P Clinical Course as of 01/03/23 2334  Wed Jan 03, 2023  2321 CT scan without signs of aneurysm.  No evidence of pneumonia.  Small hiatal hernia and bilateral adrenal adenomas noted. [JK]  2322 Serial troponins normal.  CBC metabolic panel normal. [JK]    Clinical Course User Index [JK] Linwood Dibbles, MD           HEART Score: 2                Medical Decision Making Problems Addressed: Chest pain, unspecified type: acute illness or injury Hiatal hernia: undiagnosed new problem with uncertain prognosis  Amount and/or Complexity of Data Reviewed Labs: ordered. Decision-making details documented in ED Course. Radiology: ordered and independent interpretation performed.  Risk Prescription drug management.   Patient presented to the ED for evaluation of chest pain.  Low risk heart score.  Serial troponins are normal.  EKG was reassuring.  I doubt acute coronary syndrome at this time.  Did describe the pain radiating to her back and she noted acute hypertensive and earlier.  CT angiogram performed and fortunately no signs of dissection or other acute abnormality.  Patient had been having some GI symptoms past week.  She does have a hiatal hernia.  Is possible this could be acid reflux.  Will have her try a course of antacids.  There is a history of heart disease and I do think it is reasonable for her to follow-up with cardiology as an outpatient.  MDM        Final Clinical Impression(s) / ED Diagnoses Final diagnoses:  Chest pain, unspecified type  Hiatal hernia    Rx / DC Orders ED Discharge Orders          Ordered    pantoprazole (PROTONIX) 20 MG tablet  2 times daily        01/03/23 2330    Ambulatory referral to Cardiology       Comments: If you have not heard from the Cardiology office within the next 72 hours please call [phone].    01/03/23 2332              Linwood Dibbles, MD 01/03/23 2334

## 2023-01-03 NOTE — ED Triage Notes (Signed)
Patient here POV from Home.  Endorses Mid CP that began at 1300 today. Had some N/V associated with Same initially. Constant and somewhat worsened since. Radiates to back as well. Pressure-Like.  NAD Noted during Triage. A&Ox4. GCS 15. Ambulatory.

## 2023-01-03 NOTE — ED Notes (Signed)
Patient transported to CT 

## 2023-01-03 NOTE — Discharge Instructions (Signed)
Take the medications as prescribed.  Follow-up with your primary doctor to be rechecked.  I have also placed a referral to cardiology.

## 2023-01-17 IMAGING — DX DG LUMBAR SPINE COMPLETE 4+V
5 series · 5 of 5 positions shown · non-contrast
Comparison: 04/29/2020

CLINICAL DATA: Low back pain, began [REDACTED], history of prior
surgery the

EXAM:
LUMBAR SPINE - COMPLETE 4+ VIEW

[l-spine ap]
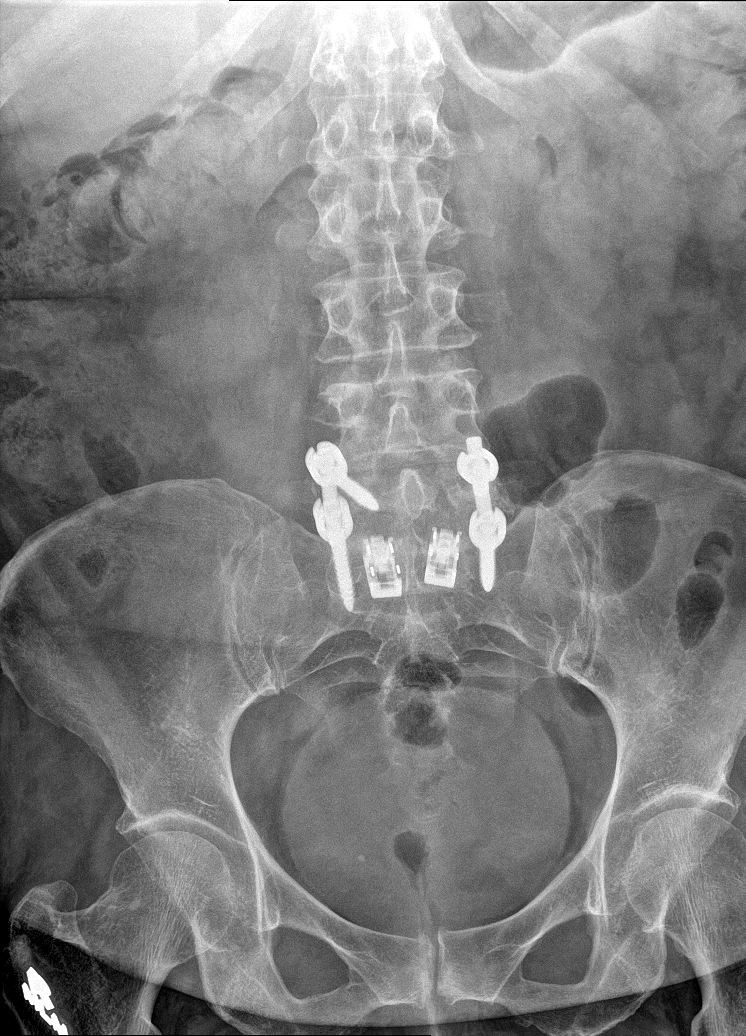

[l-spine obl (1 of 2)]
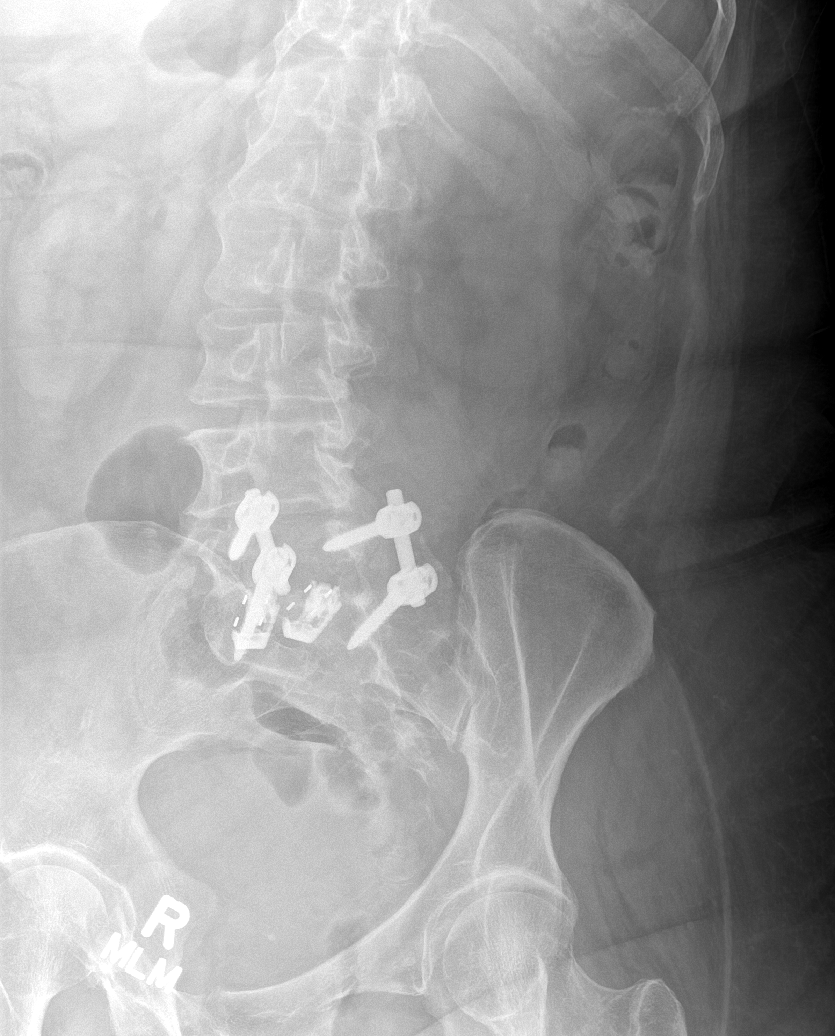

[l-spine obl (2 of 2)]
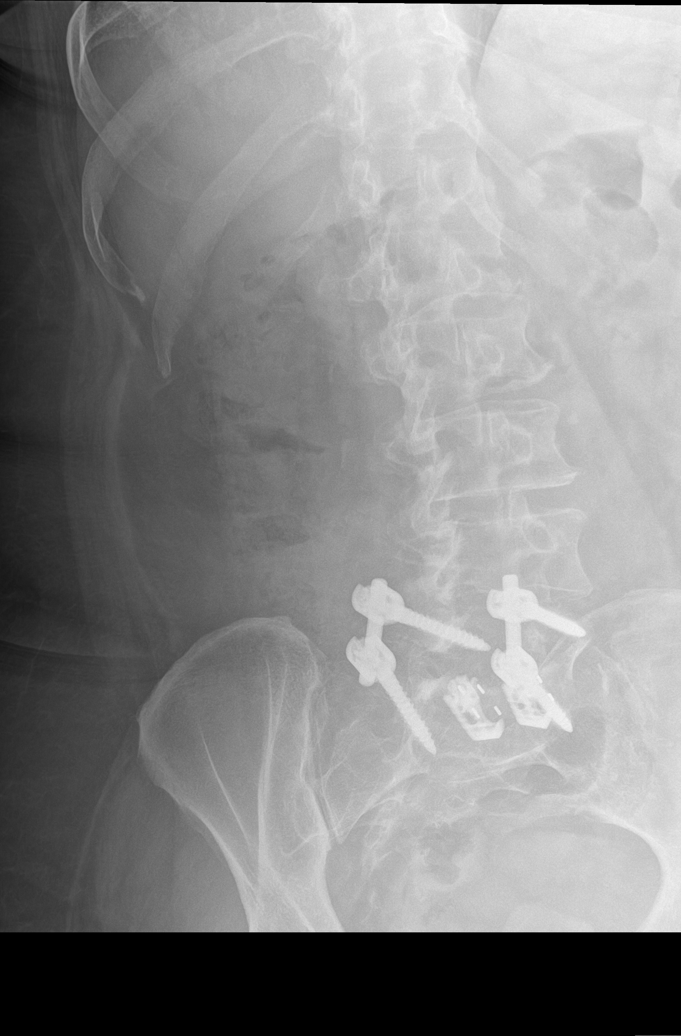

[l-spine lat (1 of 2)]
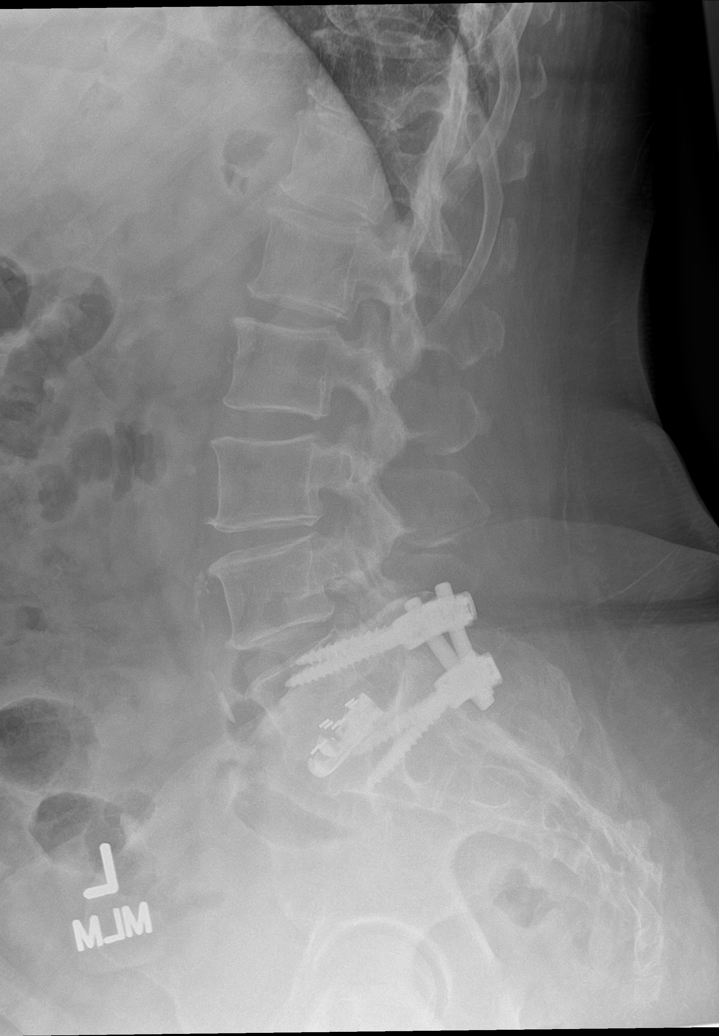

[l-spine lat (2 of 2)]
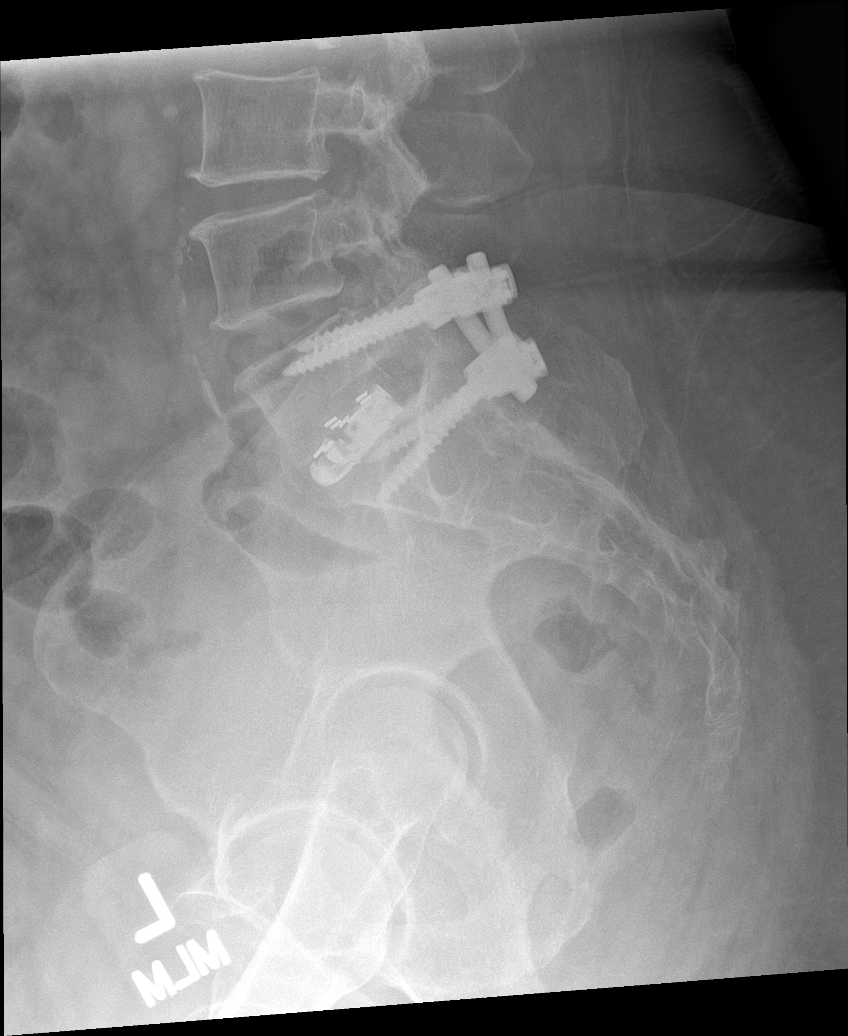

[5 of 5 positions shown; findings below may reference images not displayed]

FINDINGS: Five normally formed lumbar levels. Postsurgical changes from prior
L5-S1 posterior lumbar and interbody fusion without acute or
worrisome postsurgical complication. No evidence of hardware
fracture or failure. No acute vertebral body fracture or height
loss. No traumatic listhesis. Background of multilevel discogenic
and facet degenerative changes. Mild degenerative changes in the
hips and pelvis as well. Atherosclerotic calcification of the aorta.
Remaining soft tissues are free of acute or significant abnormality.
IMPRESSION: No acute or conspicuous osseous abnormalities.

Prior L5-S1 fusion without acute complication.

## 2023-03-19 ENCOUNTER — Other Ambulatory Visit: Payer: Self-pay

## 2023-03-19 DIAGNOSIS — J45909 Unspecified asthma, uncomplicated: Secondary | ICD-10-CM | POA: Insufficient documentation

## 2023-03-19 DIAGNOSIS — J039 Acute tonsillitis, unspecified: Secondary | ICD-10-CM | POA: Insufficient documentation

## 2023-03-19 DIAGNOSIS — F419 Anxiety disorder, unspecified: Secondary | ICD-10-CM | POA: Diagnosis not present

## 2023-03-19 DIAGNOSIS — H6693 Otitis media, unspecified, bilateral: Secondary | ICD-10-CM | POA: Insufficient documentation

## 2023-03-19 DIAGNOSIS — H9203 Otalgia, bilateral: Secondary | ICD-10-CM | POA: Diagnosis present

## 2023-03-20 ENCOUNTER — Emergency Department (HOSPITAL_BASED_OUTPATIENT_CLINIC_OR_DEPARTMENT_OTHER)
Admission: EM | Admit: 2023-03-20 | Discharge: 2023-03-20 | Disposition: A | Discharge status: Home / Self Care | Payer: No Typology Code available for payment source | Attending: Emergency Medicine | Admitting: Emergency Medicine

## 2023-03-20 ENCOUNTER — Emergency Department (HOSPITAL_BASED_OUTPATIENT_CLINIC_OR_DEPARTMENT_OTHER): Payer: No Typology Code available for payment source

## 2023-03-20 DIAGNOSIS — H6693 Otitis media, unspecified, bilateral: Secondary | ICD-10-CM

## 2023-03-20 DIAGNOSIS — J039 Acute tonsillitis, unspecified: Secondary | ICD-10-CM

## 2023-03-20 MED ORDER — KETOROLAC TROMETHAMINE 30 MG/ML IJ SOLN
30.0000 mg | Freq: Once | INTRAMUSCULAR | Status: AC
  Administered 2023-03-20: 30 mg via INTRAVENOUS
  Filled 2023-03-20: qty 1

## 2023-03-20 MED ORDER — AMOXICILLIN-POT CLAVULANATE 875-125 MG PO TABS: 1.0000 | ORAL_TABLET | Freq: Two times a day (BID) | ORAL | 0 refills | Status: DC

## 2023-03-20 MED ORDER — IOHEXOL 300 MG/ML  SOLN
100.0000 mL | Freq: Once | INTRAMUSCULAR | Status: AC | PRN
  Administered 2023-03-20: 75 mL via INTRAVENOUS

## 2023-03-20 NOTE — ED Notes (Signed)
Reviewed AVS with patient, patient expressed understanding of directions, denies further questions at this time. 

## 2023-03-20 NOTE — ED Provider Notes (Signed)
[facility] Provider Note   CSN: [id number] Arrival date & time: 03/19/23  2358     History  Chief Complaint  Patient presents with   Sore Throat    Wendy Lynn is a 57 y.o. female.  HPI     This is a 57 year old female who presents with ongoing and progressively worse bilateral ear pain and sore throat.  She states she has had symptoms at this time for the last 7 to 8 days.  She has seen her primary doctor and urgent care.  She had negative COVID, influenza, strep testing.  She was given an IM steroid shot at urgent care and states that that helped for "1 day."  Otherwise she has been using over-the-counter medications, Tylenol, ibuprofen.  She was unable to sleep tonight.  She states that she feels like her throat was swelling and she was having more difficulty swallowing this evening.  She also reports right greater than left ear pain.  No drainage.  She initially had temperatures up to 101.7 at the start of her illness; however states that she has not had a fever in several days.  Home Medications Prior to Admission medications   Medication Sig Start Date End Date Taking? Authorizing Provider  amoxicillin-clavulanate (AUGMENTIN) 875-125 MG tablet Take 1 tablet by mouth every 12 (twelve) hours. 03/20/23  Yes Billie Intriago, Barbette Hair, MD  aspirin-acetaminophen-caffeine (EXCEDRIN MIGRAINE) [phone] MG tablet Take 2 tablets by mouth daily as needed for headache.    [provider]  cetirizine (ZYRTEC) 10 MG tablet Take 10 mg by mouth daily as needed for allergies.    [provider]  lidocaine (LIDODERM) 5 % Place 1 patch onto the skin daily. Remove & Discard patch within 12 hours or as directed by MD 05/16/21   Sherwood Gambler, MD  methocarbamol (ROBAXIN) 500 MG tablet Take 1 tablet (500 mg total) by mouth 2 (two) times daily. 05/08/21   Truddie Hidden, MD  naproxen (NAPROSYN) 375 MG tablet Take 1 tablet (375 mg total) by  mouth 2 (two) times daily with a meal. 05/08/21   Truddie Hidden, MD  oxyCODONE 10 MG TABS Take 0.5-1 tablets (5-10 mg total) by mouth every 3 (three) hours as needed ((score 7 to 10)). 08/28/18   Earnie Larsson, MD  pantoprazole (PROTONIX) 20 MG tablet Take 1 tablet (20 mg total) by mouth 2 (two) times daily for 7 days. 01/03/23 01/10/23  Dorie Rank, MD  varenicline (CHANTIX) 1 MG tablet Take 1 mg by mouth 2 (two) times daily. 02/05/21   [provider]      Allergies    Flonase [fluticasone]    Review of Systems   Review of Systems  Constitutional:  Positive for fever.  HENT:  Positive for ear pain and sore throat.   All other systems reviewed and are negative.   Physical Exam Updated Vital Signs BP 117/73   Pulse 66   Temp 98.1 F (36.7 C) (Oral)   Resp 18   SpO2 99%  Physical Exam Vitals and nursing note reviewed.  Constitutional:      Appearance: She is well-developed. She is obese.  HENT:     Head: Normocephalic and atraumatic.     Ears:     Comments: Bilateral TMs erythematous and bulging with effusion, TMs intact    Nose: Congestion present.     Mouth/Throat:     Comments: Mucous membranes moist, no trismus, uvula midline without significant swelling, there  are some petechiae and general erythema in the posterior oropharynx Eyes:     Pupils: Pupils are equal, round, and reactive to light.  Cardiovascular:     Rate and Rhythm: Normal rate and regular rhythm.     Heart sounds: Normal heart sounds.  Pulmonary:     Effort: Pulmonary effort is normal. No respiratory distress.     Breath sounds: No wheezing.  Abdominal:     General: Bowel sounds are normal.     Palpations: Abdomen is soft.  Musculoskeletal:     Cervical back: Neck supple.  Skin:    General: Skin is warm and dry.  Neurological:     Mental Status: She is alert and oriented to person, place, and time.  Psychiatric:     Comments: Anxious appearing     ED Results / Procedures / Treatments    Labs (all labs ordered are listed, but only abnormal results are displayed) Labs Reviewed - No data to display  EKG None  Radiology CT Soft Tissue Neck W Contrast  Result Date: 03/20/2023 CLINICAL DATA:  Sore throat EXAM: CT NECK WITH CONTRAST TECHNIQUE: Multidetector CT imaging of the neck was performed using the standard protocol following the bolus administration of intravenous contrast. RADIATION DOSE REDUCTION: This exam was performed according to the departmental dose-optimization program which includes automated exposure control, adjustment of the mA and/or kV according to patient size and/or use of iterative reconstruction technique. CONTRAST:  63mL OMNIPAQUE IOHEXOL 300 MG/ML  SOLN COMPARISON:  03/30/2015 FINDINGS: Pharynx and larynx: The right palatine tonsils are mildly enlarged. There is no peritonsillar abscess. No retropharyngeal abnormality. The epiglottis and larynx are normal. Salivary glands: No inflammation, mass, or stone. Thyroid: Normal. Lymph nodes: Bilateral subcentimeter upper cervical lymph nodes. Vascular: Negative Limited intracranial: Normal Visualized orbits: Normal Mastoids and visualized paranasal sinuses: Clear Skeleton: No acute or aggressive process. Upper chest: Negative. Other: None. IMPRESSION: Mildly enlarged right palatine tonsils, which could indicate acute tonsillopharyngitis. No peritonsillar abscess. Electronically Signed   By: Ulyses Jarred M.D.   On: 03/20/2023 01:43    Procedures Procedures    Medications Ordered in ED Medications  ketorolac (TORADOL) 30 MG/ML injection 30 mg (30 mg Intravenous Given 03/20/23 0031)  iohexol (OMNIPAQUE) 300 MG/ML solution 100 mL (75 mLs Intravenous Contrast Given 03/20/23 0104)    ED Course/ Medical Decision Making/ A&P                             Medical Decision Making Amount and/or Complexity of Data Reviewed Radiology: ordered.  Risk Prescription drug management.   This patient presents to the ED for  concern of sore throat, ear pain, this involves an extensive number of treatment options, and is a complaint that carries with it a high risk of complications and morbidity.  I considered the following differential and admission for this acute, potentially life threatening condition.  The differential diagnosis includes viral illness, bacterial infection, acute otitis media, deep space infection such as RPA, tonsillitis  MDM:    This is a 57 year old female who presents with sore throat and bilateral ear pain.  She is nontoxic-appearing.  She is afebrile.  On exam she does have evidence of what appears to be otitis media bilaterally.  She has a fairly unremarkable oropharyngeal exam.  She has had a tonsillectomy so would not be able to appreciate any significant tonsillar hypertrophy.  Given ongoing symptoms and concerns for difficulty swallowing, I did  obtain a CT scan to evaluate for deep space infection.  Do not feel she warrants lab work.  I did review her chart and she had a normal kidney function at the end of February.  CT scan shows evidence of palatine tonsillitis.  She likely has some residual tonsillar tissues.  Regardless discussed with her starting her on Augmentin which would cover for both bacterial tonsillitis and otitis media.  Recommend follow-up closely with her primary physician.  (Labs, imaging, consults)  Labs: I Ordered, and personally interpreted labs.  The pertinent results include: None  Imaging Studies ordered: I ordered imaging studies including CT soft tissue neck I independently visualized and interpreted imaging. I agree with the radiologist interpretation  Additional history obtained from his been at bedside.  External records from outside source obtained and reviewed including urgent care and primary care notes including negative viral testing  Cardiac Monitoring: The patient was maintained on a cardiac monitor.  If on the cardiac monitor, I personally viewed and  interpreted the cardiac monitored which showed an underlying rhythm of: Sinus rhythm  Reevaluation: After the interventions noted above, I reevaluated the patient and found that they have :improved  Social Determinants of Health:  lives independently  Disposition: Discharge  Co morbidities that complicate the patient evaluation  Past Medical History:  Diagnosis Date   Asthma    AS A CHILD, GREW OUT OF IT AROUND 24 YR OF AGE   GERD (gastroesophageal reflux disease)    PONV (postoperative nausea and vomiting)    WITH PREVOUS BACK SURGERY.   Ulcer      Medicines Meds ordered this encounter  Medications   ketorolac (TORADOL) 30 MG/ML injection 30 mg   iohexol (OMNIPAQUE) 300 MG/ML solution 100 mL   amoxicillin-clavulanate (AUGMENTIN) 875-125 MG tablet    Sig: Take 1 tablet by mouth every 12 (twelve) hours.    Dispense:  20 tablet    Refill:  0    I have reviewed the patients home medicines and have made adjustments as needed  Problem List / ED Course: Problem List Items Addressed This Visit   None Visit Diagnoses     Acute ear infection, bilateral    -  Primary   Tonsillitis                       Final Clinical Impression(s) / ED Diagnoses Final diagnoses:  Acute ear infection, bilateral  Tonsillitis    Rx / DC Orders ED Discharge Orders          Ordered    amoxicillin-clavulanate (AUGMENTIN) 875-125 MG tablet  Every 12 hours        03/20/23 0154              Jerusalem Brownstein, Barbette Hair, MD 03/20/23 [phone]

## 2023-03-20 NOTE — ED Triage Notes (Signed)
Arrive POV to ED from home. A+Ox4. Ambulatory. NAD.  Complains of sore throat and ear pain bilateral x8 days. Seen Saturday at [facility]- steroid IM and oral lidocaine for suspect viral infection. -COVID - Flu - Strep.   HX HTN, chronic back pain.

## 2023-03-20 NOTE — Discharge Instructions (Signed)
You are seen today for ear pain and sore throat.  You do have evidence of likely bilateral ear infections.  Your CT scan shows tonsillitis.  This can be viral or bacterial.  Regardless she will be started on an antibiotic that will cover both tonsillitis and ear infections.  Follow-up closely with your primary physician.

## 2023-03-20 NOTE — ED Notes (Signed)
Pt returned from CT at this time.  

## 2023-05-21 ENCOUNTER — Encounter (HOSPITAL_BASED_OUTPATIENT_CLINIC_OR_DEPARTMENT_OTHER): Payer: Self-pay | Admitting: Emergency Medicine

## 2023-05-21 ENCOUNTER — Other Ambulatory Visit: Payer: Self-pay

## 2023-05-21 ENCOUNTER — Emergency Department (HOSPITAL_BASED_OUTPATIENT_CLINIC_OR_DEPARTMENT_OTHER)
Admission: EM | Admit: 2023-05-21 | Discharge: 2023-05-21 | Disposition: A | Discharge status: Home / Self Care | Payer: No Typology Code available for payment source | Attending: Emergency Medicine | Admitting: Emergency Medicine

## 2023-05-21 DIAGNOSIS — I10 Essential (primary) hypertension: Secondary | ICD-10-CM | POA: Insufficient documentation

## 2023-05-21 DIAGNOSIS — M545 Low back pain, unspecified: Secondary | ICD-10-CM | POA: Diagnosis present

## 2023-05-21 DIAGNOSIS — S39012A Strain of muscle, fascia and tendon of lower back, initial encounter: Secondary | ICD-10-CM | POA: Diagnosis not present

## 2023-05-21 DIAGNOSIS — X58XXXA Exposure to other specified factors, initial encounter: Secondary | ICD-10-CM | POA: Insufficient documentation

## 2023-05-21 DIAGNOSIS — J45909 Unspecified asthma, uncomplicated: Secondary | ICD-10-CM | POA: Diagnosis not present

## 2023-05-21 DIAGNOSIS — Z87891 Personal history of nicotine dependence: Secondary | ICD-10-CM | POA: Insufficient documentation

## 2023-05-21 DIAGNOSIS — E669 Obesity, unspecified: Secondary | ICD-10-CM | POA: Diagnosis not present

## 2023-05-21 HISTORY — DX: Essential (primary) hypertension: I10

## 2023-05-21 LAB — COMPREHENSIVE METABOLIC PANEL
ALT: 22 U/L (ref 0–44)
AST: 15 U/L (ref 15–41)
Albumin: 4.5 g/dL (ref 3.5–5.0)
Alkaline Phosphatase: 96 U/L (ref 38–126)
Anion gap: 10 (ref 5–15)
BUN: 8 mg/dL (ref 6–20)
CO2: 26 mmol/L (ref 22–32)
Calcium: 9.3 mg/dL (ref 8.9–10.3)
Chloride: 105 mmol/L (ref 98–111)
Creatinine, Ser: 0.56 mg/dL (ref 0.44–1.00)
GFR, Estimated: >60 mL/min (ref >60)
Glucose, Bld: 91 mg/dL (ref 70–99)
Potassium: 3.5 mmol/L (ref 3.5–5.1)
Sodium: 141 mmol/L (ref 135–145)
Total Bilirubin: 0.3 mg/dL (ref 0.3–1.2)
Total Protein: 7.6 g/dL (ref 6.5–8.1)

## 2023-05-21 LAB — CBC WITH DIFFERENTIAL/PLATELET
Abs Immature Granulocytes: 0.05 10*3/uL (ref 0.00–0.07)
Basophils Absolute: 0.1 10*3/uL (ref 0.0–0.1)
Basophils Relative: 1 %
Eosinophils Absolute: 0.1 10*3/uL (ref 0.0–0.5)
Eosinophils Relative: 1 %
HCT: 37.4 % (ref 36.0–46.0)
Hemoglobin: 12.7 g/dL (ref 12.0–15.0)
Immature Granulocytes: 1 %
Lymphocytes Relative: 34 %
Lymphs Abs: 3.5 10*3/uL (ref 0.7–4.0)
MCH: 33.6 pg (ref 26.0–34.0)
MCHC: 34 g/dL (ref 30.0–36.0)
MCV: 98.9 fL (ref 80.0–100.0)
Monocytes Absolute: 0.6 10*3/uL (ref 0.1–1.0)
Monocytes Relative: 6 %
Neutro Abs: 5.9 10*3/uL (ref 1.7–7.7)
Neutrophils Relative %: 57 %
Platelets: 326 10*3/uL (ref 150–400)
RBC: 3.78 MIL/uL — ABNORMAL LOW (ref 3.87–5.11)
RDW: 12.5 % (ref 11.5–15.5)
WBC: 10.2 10*3/uL (ref 4.0–10.5)
nRBC: 0 % (ref 0.0–0.2)

## 2023-05-21 LAB — URINALYSIS, ROUTINE W REFLEX MICROSCOPIC
Bilirubin Urine: NEGATIVE
Glucose, UA: NEGATIVE mg/dL
Hgb urine dipstick: NEGATIVE
Ketones, ur: NEGATIVE mg/dL
Leukocytes,Ua: NEGATIVE
Nitrite: NEGATIVE
Protein, ur: NEGATIVE mg/dL
Specific Gravity, Urine: 1.006 (ref 1.005–1.030)
pH: 6 (ref 5.0–8.0)

## 2023-05-21 LAB — LIPASE, BLOOD: Lipase: <10 U/L — ABNORMAL LOW (ref 11–51)

## 2023-05-21 MED ORDER — OXYCODONE HCL 5 MG PO TABS: 5.0000 mg | ORAL_TABLET | Freq: Four times a day (QID) | ORAL | 0 refills | Status: DC | PRN

## 2023-05-21 MED ORDER — LIDOCAINE 5 % EX PTCH: 1.0000 | MEDICATED_PATCH | Freq: Every day | CUTANEOUS | 0 refills | Status: DC | PRN

## 2023-05-21 MED ORDER — METHOCARBAMOL 500 MG PO TABS: 1000.0000 mg | ORAL_TABLET | Freq: Two times a day (BID) | ORAL | 0 refills | Status: AC

## 2023-05-21 MED ORDER — OXYCODONE HCL 5 MG PO TABS
5.0000 mg | ORAL_TABLET | Freq: Once | ORAL | Status: AC
  Administered 2023-05-21: 5 mg via ORAL
  Filled 2023-05-21: qty 1

## 2023-05-21 MED ORDER — IBUPROFEN 600 MG PO TABS: 600.0000 mg | ORAL_TABLET | Freq: Four times a day (QID) | ORAL | 0 refills | Status: DC | PRN

## 2023-05-21 MED ORDER — KETOROLAC TROMETHAMINE 15 MG/ML IJ SOLN
30.0000 mg | Freq: Once | INTRAMUSCULAR | Status: AC
  Administered 2023-05-21: 30 mg via INTRAMUSCULAR
  Filled 2023-05-21: qty 2

## 2023-05-21 MED ORDER — LIDOCAINE 5 % EX PTCH
1.0000 | MEDICATED_PATCH | Freq: Once | CUTANEOUS | Status: DC
  Administered 2023-05-21: 1 via TRANSDERMAL
  Filled 2023-05-21: qty 1

## 2023-05-21 MED ORDER — ACETAMINOPHEN 325 MG PO TABS: 650.0000 mg | ORAL_TABLET | Freq: Four times a day (QID) | ORAL | 0 refills | Status: AC | PRN

## 2023-05-21 MED ORDER — OXYCODONE-ACETAMINOPHEN 5-325 MG PO TABS
1.0000 | ORAL_TABLET | ORAL | Status: DC | PRN
  Administered 2023-05-21: 1 via ORAL
  Filled 2023-05-21: qty 1

## 2023-05-21 NOTE — ED Triage Notes (Signed)
Lower back pain since Saturday am, tylenol not helping. Hard to tell if it hurts when voids, because it hurts all the time.

## 2023-05-21 NOTE — Discharge Instructions (Addendum)
It was a pleasure caring for you today in the emergency department.  Please return to the emergency department for any worsening or worrisome symptoms.    Please follow-up with your Primary Care Physician within the next week. Please take your medications as instructed and discuss any changes to your medications with your primary care physician.    Please return to the Emergency Department if you have any leg numbness, leg weakness, difficulty walking, fevers, worsening of pain, lightheadedness, lose consciousness, severe abdominal pain, severe headache, difficulty urinating, or difficulty having a bowel movement.   Please return to the emergency department immediately for any new or concerning symptoms, or if you get worse. 

## 2023-05-21 NOTE — ED Provider Notes (Signed)
[place] EMERGENCY DEPARTMENT AT [facility] Provider Note  CSN: [id number] Arrival date & time: 05/21/23 1423  Chief Complaint(s) Back Pain  HPI Macklynn Abdelmalek is a 57 y.o. 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 Chronic right sided low back pain, on Friday/Saturday she began to have left sided low back pain that was not radiating, described as sharp, stabbing, at times burning.  No abdominal pain, nausea or vomiting, no change in bowel or bladder function.  No numbness to her extremities, no saddle paresthesias, she is ambulatory but walking slowly secondary to her left-sided back pain.  Denies any falls or injuries, no associated trauma.  No fevers, no IV drug use.  No recent final injections.  She took Tylenol at home which did provide minimal improvement to her symptoms.  Pain has essentially been constant since the onset.  Past Medical History Past Medical History:  Diagnosis Date   Asthma    AS A CHILD, GREW OUT OF IT AROUND 24 YR OF AGE   GERD (gastroesophageal reflux disease)    Hypertension    PONV (postoperative nausea and vomiting)    WITH PREVOUS BACK SURGERY.   Ulcer    Patient Active Problem List   Diagnosis Date Noted   Lumbar foraminal stenosis 08/27/2018   Home Medication(s) Prior to Admission medications   Medication Sig Start Date End Date Taking? Authorizing Provider  acetaminophen (TYLENOL) 325 MG tablet Take 2 tablets (650 mg total) by mouth every 6 (six) hours as needed. 05/21/23  Yes Tanda Rockers A, DO  ibuprofen (ADVIL) 600 MG tablet Take 1 tablet (600 mg total) by mouth every 6 (six) hours as needed. 05/21/23  Yes Tanda Rockers A, DO  lidocaine (LIDODERM) 5 % Place 1 patch onto the skin daily as needed. Remove & Discard patch within 12  hours or as directed by MD 05/21/23  Yes Sloan Leiter, DO  methocarbamol (ROBAXIN) 500 MG tablet Take 2 tablets (1,000 mg total) by mouth 2 (two) times daily for 5 days. 05/21/23 05/26/23 Yes Tanda Rockers A, DO  oxyCODONE (ROXICODONE) 5 MG immediate release tablet Take 1 tablet (5 mg total) by mouth every 6 (six) hours as needed for severe pain. 05/21/23  Yes Tanda Rockers A, DO  amoxicillin-clavulanate (AUGMENTIN) 875-125 MG tablet Take 1 tablet by mouth every 12 (twelve) hours. 03/20/23   Horton, Mayer Masker, MD  aspirin-acetaminophen-caffeine (EXCEDRIN MIGRAINE) [phone] MG tablet Take 2 tablets by mouth daily as needed for headache.    [provider]  cetirizine (ZYRTEC) 10 MG tablet Take 10 mg by mouth daily as needed for allergies.    [provider]  naproxen (NAPROSYN) 375 MG tablet Take 1 tablet (375 mg total) by mouth 2 (two) times daily with a meal. 05/08/21   Pollyann Savoy, MD  pantoprazole (PROTONIX) 20 MG tablet Take 1 tablet (20 mg total) by mouth 2 (two) times daily for 7 days. 01/03/23 01/10/23  Linwood Dibbles, MD  varenicline (CHANTIX) 1 MG tablet Take 1 mg by mouth 2 (two) times daily. 02/05/21   [provider]  Past Surgical History Past Surgical History:  Procedure Laterality Date   ABDOMINAL HYSTERECTOMY     BACK SURGERY     ELBOW SURGERY     LEFT    Family History Family History  Problem Relation Age of Onset   Heart failure Father     Social History Social History   Tobacco Use   Smoking status: Former    Packs/day: 0.50    Years: 27.00    Additional pack years: 0.00    Total pack years: 13.50    Types: Cigarettes   Smokeless tobacco: Never  Vaping Use   Vaping Use: Never used  Substance Use Topics   Alcohol use: No   Drug use: No   Allergies Flonase [fluticasone]  Review of Systems Review of  Systems  Constitutional:  Negative for chills and fever.  HENT:  Negative for facial swelling and trouble swallowing.   Eyes:  Negative for photophobia and visual disturbance.  Respiratory:  Negative for cough and shortness of breath.   Cardiovascular:  Negative for chest pain and palpitations.  Gastrointestinal:  Negative for abdominal pain, nausea and vomiting.  Endocrine: Negative for polydipsia and polyuria.  Genitourinary:  Negative for difficulty urinating and hematuria.  Musculoskeletal:  Positive for back pain. Negative for gait problem and joint swelling.  Skin:  Negative for pallor and rash.  Neurological:  Negative for syncope and headaches.  Psychiatric/Behavioral:  Negative for agitation and confusion.     Physical Exam Vital Signs  I have reviewed the triage vital signs BP (!) 140/100 (BP Location: Left Arm)   Pulse 68   Temp 98 F (36.7 C) (Oral)   Resp 18   SpO2 98%  Physical Exam Vitals and nursing note reviewed.  Constitutional:      General: She is not in acute distress.    Appearance: Normal appearance. She is obese. She is not ill-appearing or diaphoretic.  HENT:     Head: Normocephalic and atraumatic.     Right Ear: External ear normal.     Left Ear: External ear normal.     Nose: Nose normal.     Mouth/Throat:     Mouth: Mucous membranes are moist.  Eyes:     General: No scleral icterus.       Right eye: No discharge.        Left eye: No discharge.     Extraocular Movements: Extraocular movements intact.     Pupils: Pupils are equal, round, and reactive to light.  Cardiovascular:     Rate and Rhythm: Normal rate and regular rhythm.     Pulses: Normal pulses.     Heart sounds: Normal heart sounds.  Pulmonary:     Effort: Pulmonary effort is normal. No respiratory distress.     Breath sounds: Normal breath sounds.  Abdominal:     General: Abdomen is flat.     Palpations: Abdomen is soft.     Tenderness: There is no abdominal tenderness. There  is no guarding or rebound.  Musculoskeletal:        General: Normal range of motion.       Arms:     Right lower leg: No edema.     Left lower leg: No edema.     Comments: TTP to left sided paraspinal muscles.  No midline spinous process tenderness to palpation or percussion, no crepitus or step-off.     Skin:    General: Skin is warm and dry.  Capillary Refill: Capillary refill takes less than 2 seconds.  Neurological:     Mental Status: She is alert and oriented to person, place, and time.     GCS: GCS eye subscore is 4. GCS verbal subscore is 5. GCS motor subscore is 6.     Cranial Nerves: Cranial nerves 2-12 are intact.     Sensory: Sensation is intact.     Motor: Motor function is intact.     Coordination: Coordination is intact.     Comments: Antalgic gait Strength symmetric lower extremities.   Psychiatric:        Mood and Affect: Mood normal.        Behavior: Behavior normal.     ED Results and Treatments Labs (all labs ordered are listed, but only abnormal results are displayed) Labs Reviewed  URINALYSIS, ROUTINE W REFLEX MICROSCOPIC - Abnormal; Notable for the following components:      Result Value   Color, Urine COLORLESS (*)    All other components within normal limits  CBC WITH DIFFERENTIAL/PLATELET - Abnormal; Notable for the following components:   RBC 3.78 (*)    All other components within normal limits  LIPASE, BLOOD - Abnormal; Notable for the following components:   Lipase <10 (*)    All other components within normal limits  COMPREHENSIVE METABOLIC PANEL                                                                                                                          Radiology No results found.  Pertinent labs & imaging results that were available during my care of the patient were reviewed by me and considered in my medical decision making (see MDM for details).  Medications Ordered in ED Medications  oxyCODONE-acetaminophen  (PERCOCET/ROXICET) 5-325 MG per tablet 1 tablet (1 tablet Oral Given 05/21/23 1516)  lidocaine (LIDODERM) 5 % 1 patch (1 patch Transdermal Patch Applied 05/21/23 1824)  oxyCODONE (Oxy IR/ROXICODONE) immediate release tablet 5 mg (5 mg Oral Given 05/21/23 1820)  ketorolac (TORADOL) 15 MG/ML injection 30 mg (30 mg Intramuscular Given 05/21/23 1821)                                                                                                                                     Procedures Procedures  (including critical care time)  Medical Decision Making / ED Course    Medical Decision  Making:    Kayla Bettner is a 57 y.o. 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 . The complaint involves an extensive differential diagnosis and also carries with it a high risk of complications and morbidity.  Serious etiology was considered. Ddx includes but is not limited to: Differential diagnosis includes but is not exclusive to musculoskeletal back pain, renal colic, urinary tract infection, pyelonephritis, intra-abdominal causes of back pain, aortic aneurysm or dissection, cauda equina syndrome, sciatica, lumbar disc disease, thoracic disc disease, etc.   Complete initial physical exam performed, notably the patient  was no acute distress, resting comfortably on stretcher.  Neuroexam is nonfocal.    Reviewed and confirmed nursing documentation for past medical history, family history, social history.  Vital signs reviewed.    Clinical Course as of 05/21/23 1943  Mon May 21, 2023  1931 Feeling much better on recheck [SG]    Clinical Course User Index [SG] Tanda Rockers A, DO   Pain primarily to left-sided paraspinal muscles.  No midline TTP to palpation or percussion.  Neuroexam is  nonfocal. Labs reviewed and are stable.  Low suspicion for intra-abdominal infection, or uti/pyelo/nephrolithiasis given stable labs and exam. Reports improvement to her pain following oral analgesic, will give further analgesics and reassess. She is feeling much better on recheck, she is ambulatory with steady gait, repeat neurologic exam is nonfocal.  Pain greatly improved.  Concern for possible muscle strain.  Multimodal pain control for home.  Red flag symptoms discussed with patient and family.  Follow-up with a spine specialist   Patient presents with low back pain without signs of spinal cord compression, cauda equina syndrome, infection, aneurysm, or other serious etiology. The patient is neurologically intact. Given the extremely low risk of these diagnoses further testing and evaluation for these possibilities does not appear to be indicated at this time. Detailed discussions were had with the patient and/or family and caregivers, regarding current findings, and need for close f/u with PCP or on call doctor. The patient has been instructed to return immediately if the symptoms worsen in any way. Patient verbalized understanding and is in agreement with current care plan. All questions answered prior to discharge.       Additional history obtained: -Additional history obtained from spouse -External records from outside source obtained and reviewed including: Chart review including previous notes, labs, imaging, consultation notes including care documentation, home medications, prior labs and imaging   Lab Tests: -I ordered, reviewed, and interpreted labs.   The pertinent results include:   Labs Reviewed  URINALYSIS, ROUTINE W REFLEX MICROSCOPIC - Abnormal; Notable for the following components:      Result Value   Color, Urine COLORLESS (*)    All other components within normal limits  CBC WITH DIFFERENTIAL/PLATELET - Abnormal; Notable for the following components:   RBC 3.78 (*)     All other components within normal limits  LIPASE, BLOOD - Abnormal; Notable for the following components:   Lipase <10 (*)    All other components within normal limits  COMPREHENSIVE METABOLIC PANEL    Notable for labs stable  EKG   EKG Interpretation  Date/Time:    Ventricular Rate:    PR Interval:    QRS Duration:   QT Interval:    QTC Calculation:   R Axis:  Text Interpretation:           Imaging Studies ordered: na   Medicines ordered and prescription drug management: Meds ordered this encounter  Medications   oxyCODONE-acetaminophen (PERCOCET/ROXICET) 5-325 MG per tablet 1 tablet   lidocaine (LIDODERM) 5 % 1 patch   oxyCODONE (Oxy IR/ROXICODONE) immediate release tablet 5 mg   ketorolac (TORADOL) 15 MG/ML injection 30 mg   methocarbamol (ROBAXIN) 500 MG tablet    Sig: Take 2 tablets (1,000 mg total) by mouth 2 (two) times daily for 5 days.    Dispense:  20 tablet    Refill:  0   lidocaine (LIDODERM) 5 %    Sig: Place 1 patch onto the skin daily as needed. Remove & Discard patch within 12 hours or as directed by MD    Dispense:  15 patch    Refill:  0   oxyCODONE (ROXICODONE) 5 MG immediate release tablet    Sig: Take 1 tablet (5 mg total) by mouth every 6 (six) hours as needed for severe pain.    Dispense:  10 tablet    Refill:  0   acetaminophen (TYLENOL) 325 MG tablet    Sig: Take 2 tablets (650 mg total) by mouth every 6 (six) hours as needed.    Dispense:  36 tablet    Refill:  0   ibuprofen (ADVIL) 600 MG tablet    Sig: Take 1 tablet (600 mg total) by mouth every 6 (six) hours as needed.    Dispense:  30 tablet    Refill:  0    -I have reviewed the patients home medicines and have made adjustments as needed   Consultations Obtained: na   Cardiac Monitoring: The patient was maintained on a cardiac monitor.  I personally viewed and interpreted the cardiac monitored which showed an underlying rhythm of: NSR  Social Determinants of  Health:  Diagnosis or treatment significantly limited by social determinants of health: former smoker and obesity   Reevaluation: After the interventions noted above, I reevaluated the patient and found that they have improved  Co morbidities that complicate the patient evaluation  Past Medical History:  Diagnosis Date   Asthma    AS A CHILD, GREW OUT OF IT AROUND 24 YR OF AGE   GERD (gastroesophageal reflux disease)    Hypertension    PONV (postoperative nausea and vomiting)    WITH PREVOUS BACK SURGERY.   Ulcer       Dispostion: Disposition decision including need for hospitalization was considered, and patient discharged from emergency department.    Final Clinical Impression(s) / ED Diagnoses Final diagnoses:  Back strain, initial encounter     This chart was dictated using voice recognition software.  Despite best efforts to proofread,  errors can occur which can change the documentation meaning.    Sloan Leiter, DO 05/21/23 1943

## 2023-08-06 ENCOUNTER — Emergency Department (HOSPITAL_BASED_OUTPATIENT_CLINIC_OR_DEPARTMENT_OTHER): Payer: No Typology Code available for payment source | Admitting: Radiology

## 2023-08-06 ENCOUNTER — Other Ambulatory Visit: Payer: Self-pay

## 2023-08-06 ENCOUNTER — Emergency Department (HOSPITAL_BASED_OUTPATIENT_CLINIC_OR_DEPARTMENT_OTHER)
Admission: EM | Admit: 2023-08-06 | Discharge: 2023-08-06 | Disposition: A | Discharge status: Home / Self Care | Payer: No Typology Code available for payment source | Attending: Emergency Medicine | Admitting: Emergency Medicine

## 2023-08-06 ENCOUNTER — Encounter (HOSPITAL_BASED_OUTPATIENT_CLINIC_OR_DEPARTMENT_OTHER): Payer: Self-pay

## 2023-08-06 DIAGNOSIS — Z20822 Contact with and (suspected) exposure to covid-19: Secondary | ICD-10-CM | POA: Diagnosis not present

## 2023-08-06 DIAGNOSIS — J45909 Unspecified asthma, uncomplicated: Secondary | ICD-10-CM | POA: Insufficient documentation

## 2023-08-06 DIAGNOSIS — I10 Essential (primary) hypertension: Secondary | ICD-10-CM | POA: Insufficient documentation

## 2023-08-06 DIAGNOSIS — R519 Headache, unspecified: Secondary | ICD-10-CM | POA: Diagnosis not present

## 2023-08-06 DIAGNOSIS — R0602 Shortness of breath: Secondary | ICD-10-CM | POA: Diagnosis not present

## 2023-08-06 DIAGNOSIS — M7989 Other specified soft tissue disorders: Secondary | ICD-10-CM | POA: Diagnosis present

## 2023-08-06 DIAGNOSIS — R072 Precordial pain: Secondary | ICD-10-CM | POA: Insufficient documentation

## 2023-08-06 DIAGNOSIS — Z87891 Personal history of nicotine dependence: Secondary | ICD-10-CM | POA: Insufficient documentation

## 2023-08-06 LAB — COMPREHENSIVE METABOLIC PANEL
ALT: 19 U/L (ref 0–44)
AST: 14 U/L — ABNORMAL LOW (ref 15–41)
Albumin: 4.3 g/dL (ref 3.5–5.0)
Alkaline Phosphatase: 80 U/L (ref 38–126)
Anion gap: 9 (ref 5–15)
BUN: 9 mg/dL (ref 6–20)
CO2: 26 mmol/L (ref 22–32)
Calcium: 9.4 mg/dL (ref 8.9–10.3)
Chloride: 103 mmol/L (ref 98–111)
Creatinine, Ser: 0.55 mg/dL (ref 0.44–1.00)
GFR, Estimated: >60 mL/min (ref >60)
Glucose, Bld: 98 mg/dL (ref 70–99)
Potassium: 3.7 mmol/L (ref 3.5–5.1)
Sodium: 138 mmol/L (ref 135–145)
Total Bilirubin: 0.2 mg/dL — ABNORMAL LOW (ref 0.3–1.2)
Total Protein: 7.5 g/dL (ref 6.5–8.1)

## 2023-08-06 LAB — CBC WITH DIFFERENTIAL/PLATELET
Abs Immature Granulocytes: 0.02 10*3/uL (ref 0.00–0.07)
Basophils Absolute: 0.1 10*3/uL (ref 0.0–0.1)
Basophils Relative: 1 %
Eosinophils Absolute: 0.1 10*3/uL (ref 0.0–0.5)
Eosinophils Relative: 1 %
HCT: 38.8 % (ref 36.0–46.0)
Hemoglobin: 13 g/dL (ref 12.0–15.0)
Immature Granulocytes: 0 %
Lymphocytes Relative: 31 %
Lymphs Abs: 2.8 10*3/uL (ref 0.7–4.0)
MCH: 32.8 pg (ref 26.0–34.0)
MCHC: 33.5 g/dL (ref 30.0–36.0)
MCV: 98 fL (ref 80.0–100.0)
Monocytes Absolute: 0.5 10*3/uL (ref 0.1–1.0)
Monocytes Relative: 6 %
Neutro Abs: 5.5 10*3/uL (ref 1.7–7.7)
Neutrophils Relative %: 61 %
Platelets: 270 10*3/uL (ref 150–400)
RBC: 3.96 MIL/uL (ref 3.87–5.11)
RDW: 12.4 % (ref 11.5–15.5)
WBC: 9 10*3/uL (ref 4.0–10.5)
nRBC: 0 % (ref 0.0–0.2)

## 2023-08-06 LAB — TROPONIN I (HIGH SENSITIVITY)
Troponin I (High Sensitivity): 2 ng/L (ref <18)
Troponin I (High Sensitivity): 3 ng/L (ref <18)

## 2023-08-06 LAB — BRAIN NATRIURETIC PEPTIDE: B Natriuretic Peptide: 29.7 pg/mL (ref 0.0–100.0)

## 2023-08-06 LAB — SARS CORONAVIRUS 2 BY RT PCR: SARS Coronavirus 2 by RT PCR: NEGATIVE

## 2023-08-06 MED ORDER — ALBUTEROL SULFATE HFA 108 (90 BASE) MCG/ACT IN AERS
2.0000 | INHALATION_SPRAY | RESPIRATORY_TRACT | Status: DC | PRN
  Filled 2023-08-06: qty 6.7

## 2023-08-06 NOTE — ED Triage Notes (Signed)
Pt c/o BLE swelling "x awhile, worsening over past 2 days." States she feels like elephant is sitting on chest, "like an asthma attack but I've not had one in a very long time." Pt denies relevant/ known cardiac/ resp hx.

## 2023-08-06 NOTE — ED Notes (Signed)
Anticipatory labs sent to lab

## 2023-08-06 NOTE — Discharge Instructions (Signed)
Workup today for the chest pain without any acute findings.  However recommend that you follow-up with cardiology for further evaluation.  Return for any new or worse symptoms.  Call cardiology for follow-up.  Recommend starting to take a baby aspirin a day.

## 2023-08-06 NOTE — ED Provider Notes (Signed)
[place] EMERGENCY DE[place]MENT AT [facility] Provider Note   CSN: [id number] Arrival date & time: 08/06/23  1624     History  Chief Complaint  Patient presents with   Shortness of Breath   Leg Swelling    [place] is a 57 y.o. female.  Patient with a complaint of bilateral lower extremity swelling.  And then starting today maybe a little bit last evening basely felt a pressure on her chest was not like her asthma attack.  Did not feel like she was wheezing.  Patient denies any nausea or vomiting.  No known cardiac history.  Patient is a former smoker.  Past medical history significant for asthma and gastroesophageal reflux disease and hypertension.  Past surgical history sniffing for abdominal hysterectomy.  Patient is not followed by cardiology.  Associated with a headache and did have a COVID exposure with a family member.       Home Medications Prior to Admission medications   Medication Sig Start Date End Date Taking? Authorizing Provider  acetaminophen (TYLENOL) 325 MG tablet Take 2 tablets (650 mg total) by mouth every 6 (six) hours as needed. 05/21/23   Sloan Leiter, DO  amoxicillin-clavulanate (AUGMENTIN) 875-125 MG tablet Take 1 tablet by mouth every 12 (twelve) hours. 03/20/23   Horton, Mayer Masker, MD  aspirin-acetaminophen-caffeine (EXCEDRIN MIGRAINE) [phone] MG tablet Take 2 tablets by mouth daily as needed for headache.    [provider]  cetirizine (ZYRTEC) 10 MG tablet Take 10 mg by mouth daily as needed for allergies.    [provider]  ibuprofen (ADVIL) 600 MG tablet Take 1 tablet (600 mg total) by mouth every 6 (six) hours as needed. 05/21/23   Tanda Rockers A, DO  lidocaine (LIDODERM) 5 % Place 1 patch onto the skin daily as needed. Remove & Discard patch within 12 hours or as directed by MD 05/21/23   Sloan Leiter, DO  naproxen (NAPROSYN) 375 MG tablet Take 1 tablet (375 mg total) by mouth 2 (two) times daily with a meal.  05/08/21   Pollyann Savoy, MD  oxyCODONE (ROXICODONE) 5 MG immediate release tablet Take 1 tablet (5 mg total) by mouth every 6 (six) hours as needed for severe pain. 05/21/23   Sloan Leiter, DO  pantoprazole (PROTONIX) 20 MG tablet Take 1 tablet (20 mg total) by mouth 2 (two) times daily for 7 days. 01/03/23 01/10/23  Linwood Dibbles, MD  varenicline (CHANTIX) 1 MG tablet Take 1 mg by mouth 2 (two) times daily. 02/05/21   [provider]      Allergies    Flonase [fluticasone]    Review of Systems   Review of Systems  Constitutional:  Negative for chills and fever.  HENT:  Negative for ear pain and sore throat.   Eyes:  Negative for pain and visual disturbance.  Respiratory:  Positive for shortness of breath. Negative for cough.   Cardiovascular:  Positive for chest pain and leg swelling. Negative for palpitations.  Gastrointestinal:  Negative for abdominal pain and vomiting.  Genitourinary:  Negative for dysuria and hematuria.  Musculoskeletal:  Negative for arthralgias and back pain.  Skin:  Negative for color change and rash.  Neurological:  Positive for headaches. Negative for seizures and syncope.  All other systems reviewed and are negative.   Physical Exam Updated Vital Signs BP (!) 143/85   Pulse 91   Temp 98.5 F (36.9 C) (Oral)   Resp 18   SpO2 100%  Physical Exam Vitals and nursing note reviewed.  Constitutional:      General: She is not in acute distress.    Appearance: Normal appearance. She is well-developed.  HENT:     Head: Normocephalic and atraumatic.     Mouth/Throat:     Mouth: Mucous membranes are moist.  Eyes:     Extraocular Movements: Extraocular movements intact.     Conjunctiva/sclera: Conjunctivae normal.     Pupils: Pupils are equal, round, and reactive to light.  Cardiovascular:     Rate and Rhythm: Normal rate and regular rhythm.     Heart sounds: No murmur heard. Pulmonary:     Effort: Pulmonary effort is normal. No respiratory  distress.     Breath sounds: Normal breath sounds. No wheezing, rhonchi or rales.  Abdominal:     Palpations: Abdomen is soft.     Tenderness: There is no abdominal tenderness.  Musculoskeletal:        General: No swelling.     Cervical back: Neck supple. No rigidity.     Right lower leg: Edema present.     Left lower leg: Edema present.  Skin:    General: Skin is warm and dry.     Capillary Refill: Capillary refill takes less than 2 seconds.  Neurological:     General: No focal deficit present.     Mental Status: She is alert.  Psychiatric:        Mood and Affect: Mood normal.     ED Results / Procedures / Treatments   Labs (all labs ordered are listed, but only abnormal results are displayed) Labs Reviewed  COMPREHENSIVE METABOLIC PANEL - Abnormal; Notable for the following components:      Result Value   AST 14 (*)    Total Bilirubin 0.2 (*)    All other components within normal limits  SARS CORONAVIRUS 2 BY RT PCR  CBC WITH DIFFERENTIAL/PLATELET  BRAIN NATRIURETIC PEPTIDE  TROPONIN I (HIGH SENSITIVITY)  TROPONIN I (HIGH SENSITIVITY)    EKG EKG Interpretation Date/Time:  Monday August 06 2023 16:32:19 EDT Ventricular Rate:  67 PR Interval:  142 QRS Duration:  74 QT Interval:  426 QTC Calculation: 450 R Axis:   8  Text Interpretation: Normal sinus rhythm Low voltage QRS Cannot rule out Anterior infarct , age undetermined Abnormal ECG When compared with ECG of 03-Jan-2023 22:27, PREVIOUS ECG IS PRESENT No significant change since last tracing Confirmed by Vanetta Mulders [phone]) on 08/06/2023 7:13:53 PM  Radiology DG Chest 2 View  Result Date: 08/06/2023 CLINICAL DATA:  Shortness of breath EXAM: CHEST - 2 VIEW COMPARISON:  X-ray 01/03/2023 and CTA FINDINGS: Neurostimulator leads along the midthoracic spine. No consolidation, pneumothorax or effusion. No edema. Normal cardiopericardial silhouette. Degenerative changes seen along the spine. IMPRESSION: No acute  cardiopulmonary disease.  Neurostimulator Electronically Signed   By: Karen Kays M.D.   On: 08/06/2023 17:37    Procedures Procedures    Medications Ordered in ED Medications  albuterol (VENTOLIN HFA) 108 (90 Base) MCG/ACT inhaler 2 puff (has no administration in time range)    ED Course/ Medical Decision Making/ A&P                                 Medical Decision Making Amount and/or Complexity of Data Reviewed Labs: ordered. Radiology: ordered.  Risk Prescription drug management.   Patient says did not feel like wheezing.  Some mild  shortness of breath with oxygen sats are excellent upper 90s to 100%.  No fever.  Blood pressure 154/93.  EKG without any acute changes.  Troponins x 2 without any significant change.  COVID was negative COVID was done because of the exposure and because of the headache.  Exposure was with a family member.  CBC no leukocytosis hemoglobin good at 13 complete metabolic panel normal.  Anion gap normal.  BNP not elevated at 29.7 so no evidence of any fluid retention in the lungs.  Chest x-ray also negative.  Patient stable for discharge home we will have her follow-up with cardiology will have her start a baby aspirin a day. Final Clinical Impression(s) / ED Diagnoses Final diagnoses:  Precordial pain  Leg swelling    Rx / DC Orders ED Discharge Orders     None         Vanetta Mulders, MD 08/06/23 2340

## 2023-08-07 ENCOUNTER — Ambulatory Visit
Payer: No Typology Code available for payment source | Attending: Cardiovascular Disease | Admitting: Cardiovascular Disease

## 2023-08-07 ENCOUNTER — Encounter: Payer: Self-pay | Admitting: Cardiovascular Disease

## 2023-08-07 VITALS — BP 114/78 | HR 80 | Ht 62.0 in | Wt 264.0 lb

## 2023-08-07 DIAGNOSIS — I10 Essential (primary) hypertension: Secondary | ICD-10-CM | POA: Diagnosis not present

## 2023-08-07 DIAGNOSIS — R600 Localized edema: Secondary | ICD-10-CM | POA: Diagnosis not present

## 2023-08-07 MED ORDER — SPIRONOLACTONE 25 MG PO TABS
25.0000 mg | ORAL_TABLET | Freq: Every day | ORAL | 3 refills | Status: AC
Start: 2023-08-07 — End: ?

## 2023-08-07 NOTE — Patient Instructions (Signed)
Medication Instructions:  STOP Amlodipine START Spironolactone 25mg  daily *If you need a refill on your cardiac medications before your next appointment, please call your pharmacy*   Lab Work: BMET in 2-3 weeks If you have labs (blood work) drawn today and your tests are completely normal, you will receive your results only by: MyChart Message (if you have MyChart) OR A paper copy in the mail If you have any lab test that is abnormal or we need to change your treatment, we will call you to review the results.   Testing/Procedures: ECHO Your physician has requested that you have an echocardiogram. Echocardiography is a painless test that uses sound waves to create images of your heart. It provides your doctor with information about the size and shape of your heart and how well your heart's chambers and valves are working. This procedure takes approximately one hour. There are no restrictions for this procedure. Please do NOT wear cologne, perfume, aftershave, or lotions (deodorant is allowed). Please arrive 15 minutes prior to your appointment time.  Follow-Up: At [facility], you and your health needs are our priority.  As part of our continuing mission to provide you with exceptional heart care, we have created designated Provider Care Teams.  These Care Teams include your primary Cardiologist (physician) and Advanced Practice Providers (APPs -  Physician Assistants and Nurse Practitioners) who all work together to provide you with the care you need, when you need it.   Your next appointment:   3 month(s)  Provider:   Kristeen Miss, MD

## 2023-08-07 NOTE — Progress Notes (Addendum)
  Cardiology Office Note:  .   Date:  08/07/2023  ID:  [name], DOB Jun 10, 1966, MRN [id number] PCP: Clemetine Marker  [facility] Providers Cardiologist:  Paden Kuras  Click to update primary MD,subspecialty MD or APP then REFRESH:1}   History of Present Illness: .   Wendy Lynn is a 57 y.o. female with HTN, chest pain , chronic pain syndrome   Seen with husband , [name]   Developed ankle swelling several weeks ago ,  Started in right leg  Was at the hospital last night  with chest pressure , dyspnea , and worsened leg edema   Is on amlodipine 5 mg ( for 6 months )   Eats salty foods.   Frequently , several  times a week  She was at the hospital last night.  BMP shows mildly depressed potassium level.  I see that her potassium levels are almost always low.  Very little exercise ,   Has cut back on  drinking sodas ,  was drinking 2 liters of pepsi a day  Is now drinking 0 sugar soda   Works Is a Museum/gallery conservator for [employer]..   Stopped smoking a year     ROS:   Studies Reviewed: Marland Kitchen        ECG :   Aug. 12, 2024  NSR ,  no acute ST / T abn.  Poor R wave progression ( likely lead placement )   Risk Assessment/Calculations:             Physical Exam:   VS:  BP 114/78   Pulse 80   Ht 5\' 2"  (1.575 m)   Wt 264 lb (119.7 kg)   SpO2 96%   BMI 48.29 kg/m    Wt Readings from Last 3 Encounters:  08/07/23 264 lb (119.7 kg)  01/03/23 220 lb 0.3 oz (99.8 kg)  05/16/21 220 lb (99.8 kg)    GEN: middle age, moderately obese female  in no acute distress NECK: No JVD; No carotid bruits CARDIAC: RRR, no murmurs, rubs, gallops RESPIRATORY:  Clear to auscultation without rales, wheezing or rhonchi  ABDOMEN: Soft, non-tender, non-distended EXTREMITIES:  No edema; No deformity   ASSESSMENT AND PLAN: .    1.  Hypertension: She has been on amlodipine for 6 months.  She is developed leg edema and occasional headaches.  In looking at her labs her  potassium levels are frequently low.  Think she would do better on spironolactone.  Will stop the amlodipine and start spironolactone 25 mg a day.  Will check a basic metabolic profile in 2 weeks.  She eats a very high salt diet.  She is lots of processed meats.  Ive asked her to greatly reduce her intake of salty foods  Will give her some dietary recommendations including the DASH diet and Mediterranean diet.  2.  Leg swelling: Her legs were much more swollen last night according to her.  I suspect this has a lot to do with her salt intake.  Will get an echocardiogram for further evaluation of her LV function and valvular function.  I have asked her to reduce her salt intake.  I have also instructed her to start working on a regular exercise regimen.      Dispo: 2-3 months    Signed, Kristeen Miss, MD

## 2023-08-23 ENCOUNTER — Ambulatory Visit: Payer: No Typology Code available for payment source

## 2023-08-23 ENCOUNTER — Ambulatory Visit (HOSPITAL_COMMUNITY): Payer: No Typology Code available for payment source | Attending: Cardiology

## 2023-08-23 DIAGNOSIS — R600 Localized edema: Secondary | ICD-10-CM | POA: Diagnosis not present

## 2023-08-23 DIAGNOSIS — I10 Essential (primary) hypertension: Secondary | ICD-10-CM | POA: Diagnosis not present

## 2023-08-23 LAB — ECHOCARDIOGRAM COMPLETE
Area-P 1/2: 2.73 cm2
S' Lateral: 2.73 cm

## 2023-08-24 LAB — BASIC METABOLIC PANEL
BUN/Creatinine Ratio: 16 (ref 9–23)
BUN: 11 mg/dL (ref 6–24)
CO2: 29 mmol/L (ref 20–29)
Calcium: 11.5 mg/dL — ABNORMAL HIGH (ref 8.7–10.2)
Chloride: 96 mmol/L (ref 96–106)
Creatinine, Ser: 0.7 mg/dL (ref 0.57–1.00)
Glucose: 96 mg/dL (ref 70–99)
Potassium: 4.2 mmol/L (ref 3.5–5.2)
Sodium: 141 mmol/L (ref 134–144)
eGFR: 102 mL/min/{1.73_m2} (ref >59)

## 2023-11-12 ENCOUNTER — Ambulatory Visit: Payer: No Typology Code available for payment source | Admitting: Cardiovascular Disease

## 2024-01-22 ENCOUNTER — Ambulatory Visit
Payer: No Typology Code available for payment source | Attending: Cardiovascular Disease | Admitting: Cardiovascular Disease

## 2024-04-23 ENCOUNTER — Emergency Department (HOSPITAL_COMMUNITY)

## 2024-04-23 ENCOUNTER — Emergency Department (HOSPITAL_BASED_OUTPATIENT_CLINIC_OR_DEPARTMENT_OTHER)
Admission: EM | Admit: 2024-04-23 | Discharge: 2024-04-23 | Disposition: A | Discharge status: Home / Self Care | Attending: Emergency Medicine | Admitting: Emergency Medicine

## 2024-04-23 ENCOUNTER — Emergency Department (HOSPITAL_BASED_OUTPATIENT_CLINIC_OR_DEPARTMENT_OTHER)

## 2024-04-23 ENCOUNTER — Encounter (HOSPITAL_BASED_OUTPATIENT_CLINIC_OR_DEPARTMENT_OTHER): Payer: Self-pay

## 2024-04-23 ENCOUNTER — Other Ambulatory Visit: Payer: Self-pay

## 2024-04-23 DIAGNOSIS — G43809 Other migraine, not intractable, without status migrainosus: Secondary | ICD-10-CM | POA: Diagnosis not present

## 2024-04-23 DIAGNOSIS — I10 Essential (primary) hypertension: Secondary | ICD-10-CM | POA: Diagnosis not present

## 2024-04-23 DIAGNOSIS — Z87891 Personal history of nicotine dependence: Secondary | ICD-10-CM | POA: Insufficient documentation

## 2024-04-23 DIAGNOSIS — R2990 Unspecified symptoms and signs involving the nervous system: Secondary | ICD-10-CM

## 2024-04-23 DIAGNOSIS — R519 Headache, unspecified: Secondary | ICD-10-CM | POA: Diagnosis not present

## 2024-04-23 DIAGNOSIS — Z79899 Other long term (current) drug therapy: Secondary | ICD-10-CM | POA: Insufficient documentation

## 2024-04-23 DIAGNOSIS — R200 Anesthesia of skin: Secondary | ICD-10-CM | POA: Diagnosis not present

## 2024-04-23 LAB — COMPREHENSIVE METABOLIC PANEL WITH GFR
ALT: 19 U/L (ref 0–44)
AST: 19 U/L (ref 15–41)
Albumin: 4.4 g/dL (ref 3.5–5.0)
Alkaline Phosphatase: 98 U/L (ref 38–126)
Anion gap: 12 (ref 5–15)
BUN: 11 mg/dL (ref 6–20)
CO2: 25 mmol/L (ref 22–32)
Calcium: 9.8 mg/dL (ref 8.9–10.3)
Chloride: 102 mmol/L (ref 98–111)
Creatinine, Ser: 0.67 mg/dL (ref 0.44–1.00)
GFR, Estimated: >60 mL/min (ref >60)
Glucose, Bld: 88 mg/dL (ref 70–99)
Potassium: 4 mmol/L (ref 3.5–5.1)
Sodium: 139 mmol/L (ref 135–145)
Total Bilirubin: <0.2 mg/dL (ref 0.0–1.2)
Total Protein: 7.3 g/dL (ref 6.5–8.1)

## 2024-04-23 LAB — DIFFERENTIAL
Abs Immature Granulocytes: 0.02 10*3/uL (ref 0.00–0.07)
Basophils Absolute: 0.1 10*3/uL (ref 0.0–0.1)
Basophils Relative: 1 %
Eosinophils Absolute: 0.1 10*3/uL (ref 0.0–0.5)
Eosinophils Relative: 1 %
Immature Granulocytes: 0 %
Lymphocytes Relative: 30 %
Lymphs Abs: 2.5 10*3/uL (ref 0.7–4.0)
Monocytes Absolute: 0.6 10*3/uL (ref 0.1–1.0)
Monocytes Relative: 8 %
Neutro Abs: 5 10*3/uL (ref 1.7–7.7)
Neutrophils Relative %: 60 %

## 2024-04-23 LAB — CBC
HCT: 38.8 % (ref 36.0–46.0)
Hemoglobin: 13 g/dL (ref 12.0–15.0)
MCH: 31.6 pg (ref 26.0–34.0)
MCHC: 33.5 g/dL (ref 30.0–36.0)
MCV: 94.2 fL (ref 80.0–100.0)
Platelets: 287 10*3/uL (ref 150–400)
RBC: 4.12 MIL/uL (ref 3.87–5.11)
RDW: 13.1 % (ref 11.5–15.5)
WBC: 8.3 10*3/uL (ref 4.0–10.5)
nRBC: 0 % (ref 0.0–0.2)

## 2024-04-23 LAB — ETHANOL: Alcohol, Ethyl (B): <15 mg/dL (ref <15)

## 2024-04-23 LAB — CBG MONITORING, ED: Glucose-Capillary: 123 mg/dL — ABNORMAL HIGH (ref 70–99)

## 2024-04-23 LAB — APTT: aPTT: 30 s (ref 24–36)

## 2024-04-23 LAB — PROTIME-INR
INR: 0.9 (ref 0.8–1.2)
Prothrombin Time: 12.2 s (ref 11.4–15.2)

## 2024-04-23 MED ORDER — DIPHENHYDRAMINE HCL 50 MG/ML IJ SOLN
12.5000 mg | Freq: Once | INTRAMUSCULAR | Status: AC
Start: 2024-04-23 — End: 2024-04-23
  Administered 2024-04-23: 12.5 mg via INTRAVENOUS
  Filled 2024-04-23: qty 1

## 2024-04-23 MED ORDER — DIPHENHYDRAMINE HCL 50 MG/ML IJ SOLN
25.0000 mg | Freq: Once | INTRAMUSCULAR | Status: AC
  Administered 2024-04-23: 25 mg via INTRAVENOUS
  Filled 2024-04-23: qty 1

## 2024-04-23 MED ORDER — IOHEXOL 350 MG/ML SOLN
100.0000 mL | Freq: Once | INTRAVENOUS | Status: AC | PRN
  Administered 2024-04-23: 75 mL via INTRAVENOUS

## 2024-04-23 MED ORDER — SODIUM CHLORIDE 0.9 % IV BOLUS
500.0000 mL | Freq: Once | INTRAVENOUS | Status: AC
  Administered 2024-04-23: 500 mL via INTRAVENOUS

## 2024-04-23 MED ORDER — BUTALBITAL-APAP-CAFFEINE 50-325-40 MG PO TABS
1.0000 | ORAL_TABLET | Freq: Four times a day (QID) | ORAL | 0 refills | Status: DC | PRN
Start: 2024-04-23 — End: 2024-05-26

## 2024-04-23 MED ORDER — PROCHLORPERAZINE EDISYLATE 10 MG/2ML IJ SOLN
10.0000 mg | Freq: Once | INTRAMUSCULAR | Status: AC
  Administered 2024-04-23: 10 mg via INTRAVENOUS
  Filled 2024-04-23: qty 2

## 2024-04-23 MED ORDER — METOCLOPRAMIDE HCL 5 MG/ML IJ SOLN
10.0000 mg | Freq: Once | INTRAMUSCULAR | Status: AC
  Administered 2024-04-23: 10 mg via INTRAVENOUS
  Filled 2024-04-23: qty 2

## 2024-04-23 MED ORDER — KETOROLAC TROMETHAMINE 30 MG/ML IJ SOLN
30.0000 mg | Freq: Once | INTRAMUSCULAR | Status: AC
  Administered 2024-04-23: 30 mg via INTRAVENOUS
  Filled 2024-04-23: qty 1

## 2024-04-23 NOTE — ED Notes (Signed)
 Called report to [place], Consulting civil engineer at [facility] ED.

## 2024-04-23 NOTE — ED Provider Notes (Signed)
 Care of patient received from prior provider at 9:33 PM, please see their note for complete H/P and care plan.  Received handoff per ED course.  Clinical Course as of 04/23/24 2133  Wed Apr 23, 2024  [facility] [CC]    Clinical Course User Index [CC] [name], MD    Reassessment: I was called by the MRI team, they are unable to perform her MRI because her spinal stimulator is on low battery. Patient states that all the visual symptoms have completely resolved at this time.  She states the main reason she presented earlier today was a sudden onset headache.  She is already had a CT head and a CTA that is negative for acute pathology. The MRI was more to workup the visual symptoms and rule out CVA. Redosed her medications and now headache is resolved on my serial reassessment.  She states that she would feel comfortable in the outpatient setting following back up with neurology.  Discussed observing her overnight with  MRI in the a.m. but patient declined.  Disposition:  I have considered need for hospitalization, however, considering all of the above, I believe this patient is stable for discharge at this time.  Patient/family educated about specific return precautions for given chief complaint and symptoms.  Patient/family educated about follow-up with PCP.      Patient/family expressed understanding of return precautions and need for follow-up. Patient spoken to regarding all imaging and laboratory results and appropriate follow up for these results. All education provided in verbal form with additional information in written form. Time was allowed for answering of patient questions. Patient discharged.    Emergency Department Medication Summary:   Medications  sodium chloride  0.9 % bolus 500 mL ( Intravenous Stopped 04/23/24 1449)  diphenhydrAMINE  (BENADRYL ) injection 12.5 mg (12.5 mg Intravenous Given 04/23/24 1357)  metoCLOPramide  (REGLAN ) injection 10 mg (10 mg Intravenous  Given 04/23/24 1357)  ketorolac  (TORADOL ) 30 MG/ML injection 30 mg (30 mg Intravenous Given 04/23/24 1357)  iohexol  (OMNIPAQUE ) 350 MG/ML injection 100 mL (75 mLs Intravenous Contrast Given 04/23/24 1411)  prochlorperazine (COMPAZINE) injection 10 mg (10 mg Intravenous Given 04/23/24 2130)  diphenhydrAMINE  (BENADRYL ) injection 25 mg (25 mg Intravenous Given 04/23/24 2130)            [name], MD 04/23/24 2133

## 2024-04-23 NOTE — ED Notes (Addendum)
 [facility] at bedside to transport pt to [facility] ED. Report given to Counselling psychologist at [facility] by Mylinda Asa RN

## 2024-04-23 NOTE — ED Triage Notes (Signed)
 Pt bib [facility] from [facility]; c/o thunderclap ha, numbness and tingling in face and right side; code stroke called, then cancelled; CTA and head ct negative; migraine cocktail given and 500 cc saline; pain 6/10 right side head; 152/78, HR 60, 98% RA; 20 ga lac; a and o x 4, ambulatory; sent to [facility] for MRI

## 2024-04-23 NOTE — ED Triage Notes (Signed)
 Pt via pov from home with headache x 1 hour; states she has had headaches in the past, but this one is on the right side of her head, which has not been the case in the past. She took her bp at home, was 169/110. Pt endorses vision changes in right eye, has reduced sensation in right side of face, arm, leg. Pt alert & oriented, nad noted.   Some drift in left leg; states this is her weak leg due to back problems.   Dr Scarlette Currier at bedside.

## 2024-04-23 NOTE — ED Notes (Signed)
 Into room to take over the patient.

## 2024-04-23 NOTE — ED Provider Notes (Signed)
 [place] EMERGENCY DEPARTMENT AT [facility] Provider Note   CSN: [id number] Arrival date & time: 04/23/24  1231  An emergency department physician performed an initial assessment on this suspected stroke patient at 1237.  History  Chief Complaint  Patient presents with   Headache    Adisen Hutley is a 58 y.o. female.  Pt is a 58 yo female with pmhx significant for HTN, GERD, hx tob abuse, anxiety, depression, and chronic pain (tylenol  + nerve stimulator).  Pt works from home and was fine and was working.  Then, at 1130, she suddenly felt a severe headache to the right side of her head.  BP was elevated.  She had decreased vision on the right, right facial numbness and right arm numbness.  No weakness.  Pt came to the ED and a code stroke was called.        Home Medications Prior to Admission medications   Medication Sig Start Date End Date Taking? Authorizing Provider  famotidine (PEPCID) 40 MG tablet Take 20 mg by mouth 2 (two) times daily. 02/08/24  Yes [provider]  montelukast (SINGULAIR) 10 MG tablet Take 10 mg by mouth at bedtime. 03/13/24  Yes [provider]  omeprazole (PRILOSEC) 40 MG capsule Take 40 mg by mouth every morning. 02/08/24  Yes [provider]  sertraline (ZOLOFT) 50 MG tablet Take 1 tablet by mouth daily. 03/03/24  Yes [provider]  acetaminophen  (TYLENOL ) 325 MG tablet Take 2 tablets (650 mg total) by mouth every 6 (six) hours as needed. 05/21/23   Teddi Favors, DO  albuterol  (VENTOLIN  HFA) 108 (90 Base) MCG/ACT inhaler Inhale 1 puff into the lungs as needed for wheezing or shortness of breath.    [provider]  cetirizine (ZYRTEC) 10 MG tablet Take 10 mg by mouth daily as needed for allergies.    [provider]  cholecalciferol (VITAMIN D3) 25 MCG (1000 UNIT) tablet Take 1,000 Units by mouth daily.    [provider]  ibuprofen  (ADVIL ) 600 MG tablet Take 1 tablet (600 mg total)  by mouth every 6 (six) hours as needed. 05/21/23   Teddi Favors, DO  omeprazole (PRILOSEC) 20 MG capsule Take 20 mg by mouth daily.    [provider]  spironolactone  (ALDACTONE ) 25 MG tablet Take 1 tablet (25 mg total) by mouth daily. 08/07/23   Nahser, Lela Purple, MD      Allergies    Flonase [fluticasone]    Review of Systems   Review of Systems  Neurological:  Positive for numbness and headaches.  All other systems reviewed and are negative.   Physical Exam Updated Vital Signs BP (!) 154/87 (BP Location: Right Arm)   Pulse 64   Temp 98.2 F (36.8 C) (Oral)   Resp 15   Ht 5\' 2"  (1.575 m)   Wt 119.7 kg   SpO2 99%   BMI 48.27 kg/m  Physical Exam Vitals and nursing note reviewed.  Constitutional:      Appearance: She is well-developed. She is obese.  HENT:     Head: Normocephalic and atraumatic.     Mouth/Throat:     Mouth: Mucous membranes are moist.     Pharynx: Oropharynx is clear.  Eyes:     Extraocular Movements: Extraocular movements intact.     Pupils: Pupils are equal, round, and reactive to light.  Cardiovascular:     Rate and Rhythm: Normal rate and regular rhythm.     Heart sounds:  Normal heart sounds.  Pulmonary:     Effort: Pulmonary effort is normal.     Breath sounds: Normal breath sounds.  Abdominal:     General: Bowel sounds are normal.     Palpations: Abdomen is soft.  Musculoskeletal:        General: Normal range of motion.     Cervical back: Normal range of motion and neck supple.  Skin:    General: Skin is warm.     Capillary Refill: Capillary refill takes less than 2 seconds.  Neurological:     Mental Status: She is alert and oriented to person, place, and time.     Comments: Right face and arm numbness  Psychiatric:        Mood and Affect: Mood normal.        Behavior: Behavior normal.    ED Results / Procedures / Treatments   Labs (all labs ordered are listed, but only abnormal results are displayed) Labs Reviewed  CBG  MONITORING, ED - Abnormal; Notable for the following components:      Result Value   Glucose-Capillary 123 (*)    All other components within normal limits  ETHANOL  PROTIME-INR  APTT  CBC  DIFFERENTIAL  COMPREHENSIVE METABOLIC PANEL WITH GFR  URINE DRUG SCREEN    EKG EKG Interpretation Date/Time:  Wednesday April 23 2024 13:25:53 EDT Ventricular Rate:  56 PR Interval:  120 QRS Duration:  103 QT Interval:  449 QTC Calculation: 434 R Axis:   9  Text Interpretation: Sinus rhythm Low voltage, precordial leads Borderline T abnormalities, diffuse leads Minimal ST elevation, inferior leads spinal stimulator in place.  no definite st elevations. Confirmed by Sueellen Emery [phone]) on 04/23/2024 2:34:39 PM  Radiology CT HEAD CODE STROKE WO CONTRAST Result Date: 04/23/2024 CLINICAL DATA:  Code stroke. Headache. Pain is atypical of her usual headaches as it is on the right side. Visual changes in the right eye. Decreased sensation in the right face, upper and lower extremity. EXAM: CT HEAD WITHOUT CONTRAST TECHNIQUE: Contiguous axial images were obtained from the base of the skull through the vertex without intravenous contrast. RADIATION DOSE REDUCTION: This exam was performed according to the departmental dose-optimization program which includes automated exposure control, adjustment of the mA and/or kV according to patient size and/or use of iterative reconstruction technique. COMPARISON:  CT head without contrast 08/24/2009 FINDINGS: Brain: No acute infarct, hemorrhage, or mass lesion is present. The ventricles are of normal size. No significant extraaxial fluid collection is present. No significant white matter lesions are present. Deep brain nuclei are within normal limits. 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 Brainstem and cerebellum are within normal limits. Midline structures are otherwise normal. Vascular: No hyperdense vessel or unexpected calcification. Skull: Calvarium is  intact. No focal lytic or blastic lesions are present. No significant extracranial soft tissue lesion is present. Sinuses/Orbits: The paranasal sinuses and mastoid air cells are clear. The globes and orbits are within normal limits. ASPECTS [facility] Stroke Program Early CT Score) - Ganglionic level infarction (caudate, lentiform nuclei, internal capsule, insula, M1-M3 cortex): 7/7 - Supraganglionic infarction (M4-M6 cortex): 3/3 Total score (0-10 with 10 being normal): 10/10 IMPRESSION: 1. No acute intracranial abnormality or significant interval change. 2. Aspects is 10/10. 3. 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 This is a nonspecific finding but can be seen in the setting of idiopathic intracranial hypertension. The above was relayed via text pager to Dr. Cleone Dad on 04/23/2024 at 13:06 . Electronically  Signed   By: Audree Leas M.D.   On: 04/23/2024 13:06    Procedures Procedures    Medications Ordered in ED Medications  sodium chloride  0.9 % bolus 500 mL (500 mLs Intravenous New Bag/Given 04/23/24 1357)  diphenhydrAMINE  (BENADRYL ) injection 12.5 mg (12.5 mg Intravenous Given 04/23/24 1357)  metoCLOPramide  (REGLAN ) injection 10 mg (10 mg Intravenous Given 04/23/24 1357)  ketorolac  (TORADOL ) 30 MG/ML injection 30 mg (30 mg Intravenous Given 04/23/24 1357)  iohexol  (OMNIPAQUE ) 350 MG/ML injection 100 mL (75 mLs Intravenous Contrast Given 04/23/24 1411)    ED Course/ Medical Decision Making/ A&P Clinical Course as of 04/28/24 0849  Wed Apr 23, 2024  [address] [facility] [CC]    Clinical Course User Index [CC] [name], MD                                 Medical Decision Making Amount and/or Complexity of Data Reviewed Labs: ordered. Radiology: ordered.  Risk Prescription drug management.   This patient presents to the ED for concern of headache and facial numbness, this involves an extensive number of treatment options, and is a complaint that carries with it a high risk  of complications and morbidity.  The differential diagnosis includes cva, tia, migraine   Co morbidities that complicate the patient evaluation  HTN, GERD, hx tob abuse, anxiety, depression, and chronic pain (tylenol  + nerve stimulator)   Additional history obtained:  Additional history obtained from epic chart review External records from outside source obtained and reviewed including husband   Lab Tests:  I Ordered, and personally interpreted labs.  The pertinent results include:  cbc nl, cmp nl, etoh neg, inr nl   Imaging Studies ordered:  I ordered imaging studies including ct head; cta head/neck  I independently visualized and interpreted imaging which showed  CT head:  I agree with the radiologist interpretation   Cardiac Monitoring:  The patient was maintained on a cardiac monitor.  I personally viewed and interpreted the cardiac monitored which showed an underlying rhythm of: nsr   Medicines ordered and prescription drug management:  I ordered medication including ivfs/reglan /benadryl /toradol   for sx  Reevaluation of the patient after these medicines showed that the patient improved I have reviewed the patients home medicines and have made adjustments as needed   Test Considered:  ct   Critical Interventions:  Code stroke   Consultations Obtained:  I requested consultation with the neurologist (Dr. Cleone Dad),  and discussed lab and imaging findings as well as pertinent plan - she recommends: Recommendations:  - Every 30 minute neurochecks until out of the window for TNK, reactivate code stroke if patient is developing disabling symptoms - Symptomatic treatment of headache per ED - CTA head and neck, stat - MRI brain without contrast - Final recommendations pending imaging - After the studies are complete, shared decision making with patient.  Last echocardiogram August 2024 was fairly benign and she denies any new cardiac symptoms to me.  Therefore if  CTA head and neck and MRI brain are reassuring and her headache is improved with symptomatic treatment could follow-up outpatient, versus admission to complete stroke workup (echocardiogram, lipid panel, A1c)  Problem List / ED Course:  Numbness/headache:  pt;s sx are not severe enough for TNK.  CTA pending.  MRI pending.  Pt d/w Dr. Inga Manges who accepted pt for transfer to ED at [facility]   Reevaluation:  After the interventions noted above, I  reevaluated the patient and found that they have :improved   Social Determinants of Health:  Lives at home   Dispostion:  Pending results of MRI        Final Clinical Impression(s) / ED Diagnoses Final diagnoses:  Numbness  Acute nonintractable headache, unspecified headache type    Rx / DC Orders ED Discharge Orders     None         Sueellen Emery, MD 04/28/24 [phone]

## 2024-04-23 NOTE — Consult Note (Signed)
 [facility] Telemedicine Consult   Requesting Provider: Sueellen Emery, MD Consult Participants: Myself, bedside nurse, atrium nurse, patient Location of the provider: [facility] Location of the patient: [facility] ED  This consult was provided via telemedicine with 2-way video and audio communication. The patient/family was informed that care would be provided in this way and agreed to receive care in this manner.    Chief Complaint: Headache, right sided numbness  HPI: This is a 58 year old woman with a past medical history significant for hypertension, hyperlipidemia, pre-diabetes, smoking, BMI 48, generalized anxiety disorder, depression, chronic pain on daily Tylenol   She reports she was in her usual state of health today when she woke up at 6:30 AM.  At 11:30 AM she had a sudden onset headache associated with high blood pressure and some right sided numbness.  Very mild right sided vision change.  She does not feel that any of the symptoms are disabling and at no point has she had any weakness  She reports a remote history of migraine headaches, has not had any in many years.  Very rarely taking ibuprofen  due to it causing stomach upset, but typically takes Tylenol  at least daily  LKW: 11:30 AM Thrombolytic given?: No, too mild to treat Checklist of contradindications was reviewed, if symptoms became disabling patient would be interested in the medication.  Risks, benefits and alternatives were discussed IR Thrombectomy? No, exam not consistent with LVO Modified Rankin scale: 0  Time of teleneurologist evaluation: 1253 AM  Exam: Vitals:   04/23/24 1307  BP: (!) 154/87  Pulse: 64  Resp: 15  SpO2: 99%    General: Mildly uncomfortable appearing Pulmonary: breathing comfortably Cardiac: regular rate and rhythm on monitor   NIH Stroke scale 1A: Level of Consciousness - 0 1B: Ask Month and Age - 0 1C: 'Blink Eyes' & 'Squeeze Hands' - 0 2:  Test Horizontal Extraocular Movements - 0 3: Test Visual Fields - 0 4: Test Facial Palsy - 0 5A: Test Left Arm Motor Drift - 0 5B: Test Right Arm Motor Drift - 0 6A: Test Left Leg Motor Drift - 0 6B: Test Right Leg Motor Drift - 1 (baseline deficits secondary to pain from chronic right leg pain) 7: Test Limb Ataxia - 0 8: Test Sensation - 1 (chronic right leg numbness but face and arm numbness today are new) 9: Test Language/Aphasia- 0 10: Test Dysarthria - 0 11: Test Extinction/Inattention - 0 NIHSS score: 2   Imaging Reviewed:  Head CT personally reviewed, agree with radiology no acute intracranial process  Labs reviewed in epic and pertinent values follow:  Basic Metabolic Panel: No results for input(s): "NA", "K", "CL", "CO2", "GLUCOSE", "BUN", "CREATININE", "CALCIUM", "MG", "PHOS" in the last 168 hours.  CBC: No results for input(s): "WBC", "NEUTROABS", "HGB", "HCT", "MCV", "PLT" in the last 168 hours.  Coagulation Studies: No results for input(s): "LABPROT", "INR" in the last 72 hours.   No results found for: "CHOL", "HDL", "LDLCALC", "LDLDIRECT", "TRIG", "CHOLHDL"  No results found for: "HGBA1C"   Capillary blood glucose 123; A1c 5.7% 08/06/2023  ECHO 08/23/2023  1. Left ventricular ejection fraction, by estimation, is 55 to 60%. The  left ventricle has normal function. The left ventricle has no regional  wall motion abnormalities. Left ventricular diastolic parameters are  consistent with Grade I diastolic dysfunction (impaired relaxation).   2. Right ventricular systolic function is normal. The right ventricular  size is normal.   3. The mitral valve is normal in  structure. No evidence of mitral valve  regurgitation. No evidence of mitral stenosis.   4. The aortic valve is tricuspid. Aortic valve regurgitation is not  visualized. No aortic stenosis is present.   5. The inferior vena cava is normal in size with greater than 50%  respiratory variability, suggesting  right atrial pressure of 3 mmHg.   Assessment: This is a 58 year old woman with significant stroke risk factors including hypertension, hyperlipidemia, BMI 48, smoking history, as well as a history of migraine headaches  Differential for her presentation includes Complex migraine Lacunar stroke affecting left thalamus or other subcortical sensory tract location Given thunderclap onset of her headache also consider PRES/RCVS With head CT negative for ICH within 5 hours of symptom onset aneurysmal bleed is low in the differential (95% sensitivity of Head CT)  Recommendations:  - Every 30 minute neurochecks until out of the window for TNK, reactivate code stroke if patient is developing disabling symptoms - Symptomatic treatment of headache per ED - CTA head and neck, stat - MRI brain without contrast - Final recommendations pending imaging - After the studies are complete, shared decision making with patient.  Last echocardiogram August 2024 was fairly benign and she denies any new cardiac symptoms to me.  Therefore if CTA head and neck and MRI brain are reassuring and her headache is improved with symptomatic treatment could follow-up outpatient, versus admission to complete stroke workup (echocardiogram, lipid panel, A1c)   Baldwin Levee MD-PhD [facility] [phone]   If 8pm-8am, please page neurology on call as listed in AMION.  CRITICAL CARE Performed by: Ronnette Coke   Total critical care time: 40 minutes  Critical care time was exclusive of separately billable procedures and treating other patients.  Critical care was necessary to treat or prevent imminent or life-threatening deterioration.  Critical care was time spent personally by me on the following activities: development of treatment plan with patient and/or surrogate as well as nursing, discussions with consultants, evaluation of patient's response to treatment, examination of patient, obtaining  history from patient or surrogate, ordering and performing treatments and interventions, ordering and review of laboratory studies, ordering and review of radiographic studies, pulse oximetry and re-evaluation of patient's condition.  Discussed with ED provider Dr. Sueellen Emery via secure chat

## 2024-04-23 NOTE — ED Notes (Signed)
 Decision: No TNK at this time. Reports LKW approx 1130.

## 2024-04-23 NOTE — ED Notes (Signed)
 Called Thomas at [facility] to follow up on transport; truck available and being sent now

## 2024-04-23 NOTE — Progress Notes (Signed)
 [facility] Telestroke RN- Code Stroke Note  1245- Code stroke activation- EDP has already assessed 1250- Pt taken for CT. Dr Cleone Dad paged [phone]- Dr Cleone Dad on cart 1257- Pt returned from CT. NIH completed.   LKW 1130 MRS 0 NIH 2 for chronic L leg weakness, and decreased sensation face/arm

## 2024-04-23 NOTE — ED Notes (Signed)
 Called Wendy Lynn at [facility] for transport to [facility] ED-Dr. Inga Manges, accepting

## 2024-05-26 ENCOUNTER — Telehealth: Payer: Self-pay

## 2024-05-26 ENCOUNTER — Encounter: Payer: Self-pay | Admitting: Neurology

## 2024-05-26 ENCOUNTER — Ambulatory Visit: Admitting: Neurology

## 2024-05-26 ENCOUNTER — Other Ambulatory Visit (HOSPITAL_COMMUNITY): Payer: Self-pay

## 2024-05-26 VITALS — BP 153/83 | HR 51 | Ht 62.0 in | Wt 257.8 lb

## 2024-05-26 DIAGNOSIS — G43709 Chronic migraine without aura, not intractable, without status migrainosus: Secondary | ICD-10-CM | POA: Diagnosis not present

## 2024-05-26 MED ORDER — EMGALITY 120 MG/ML ~~LOC~~ SOAJ: 240.0000 mg | Freq: Once | SUBCUTANEOUS | 0 refills | Status: DC

## 2024-05-26 MED ORDER — ELETRIPTAN HYDROBROMIDE 40 MG PO TABS: 40.0000 mg | ORAL_TABLET | ORAL | 11 refills | Status: AC | PRN

## 2024-05-26 NOTE — Patient Instructions (Addendum)
 Prevention: Start monthly injection either Ajovy or emgality.   Acute management: Relpax - newer medications are nurtec and ubrelvy  Eletriptan Tablets What is this medication? ELETRIPTAN (el ih TRIP tan) treats migraines. It works by blocking pain signals and narrowing blood vessels in the brain. It belongs to a group of medications called triptans. It is not used to prevent migraines. This medicine may be used for other purposes; ask your health care provider or pharmacist if you have questions. COMMON BRAND NAME(S): Relpax What should I tell my care team before I take this medication? They need to know if you have any of these conditions: Circulation problems in fingers and toes Diabetes Heart disease High blood pressure High cholesterol History of irregular heartbeat History of stroke Kidney disease Liver disease Stomach or intestine problems Tobacco use An unusual or allergic reaction to eletriptan, other medications, foods, dyes, or preservatives Pregnant or trying to get pregnant Breastfeeding How should I use this medication? Take this medication by mouth with a glass of water. Take it as directed on the prescription label. Do not take it more often than directed. Keep taking it unless your care team tells you to stop. Talk to your care team about the use of this medication in children. Special care may be needed. Overdosage: If you think you have taken too much of this medicine contact a poison control center or emergency room at once. NOTE: This medicine is only for you. Do not share this medicine with others. What if I miss a dose? This does not apply. This medication is not for regular use. What may interact with this medication? Do not take this medication with any of the following: Adagrasib Ceritinib Certain antibiotics, such as clarithromycin or telithromycin Certain antivirals for HIV or hepatitis Certain medications for fungal infections, such as ketoconazole,  itraconazole, or posaconazole Certain medications for migraine headache, such as almotriptan, frovatriptan, naratriptan, rizatriptan, sumatriptan, zolmitriptan Chloramphenicol Ergot alkaloids, such as dihydroergotamine, ergonovine, ergotamine, methylergonovine Idelalisib Mifepristone Nefazodone Ribociclib This medication may also interact with the following: Certain medications for depression, anxiety, or mental health conditions MAOIs, such as Carbex, Eldepryl, Marplan, Nardil, and Parnate This list may not describe all possible interactions. Give your health care provider a list of all the medicines, herbs, non-prescription drugs, or dietary supplements you use. Also tell them if you smoke, drink alcohol, or use illegal drugs. Some items may interact with your medicine. What should I watch for while using this medication? Visit your care team for regular checks on your progress. Tell your care team if your symptoms do not start to get better or if they get worse. This medication may affect your coordination, reaction time, or judgment. Do not drive or operate machinery until you know how this medication affects you. Sit up or stand slowly to reduce the risk of dizzy or fainting spells. Drinking alcohol with this medication can increase the risk of these side effects. Your mouth may get dry. Chewing sugarless gum or sucking hard candy and drinking plenty of water may help. Contact your care team if the problem does not go away or is severe. If you take migraine medications for 10 or more days a month, your migraines may get worse. Keep a diary of headache days and medication use. Contact your care team if your migraine attacks occur more frequently. What side effects may I notice from receiving this medication? Side effects that you should report to your care team as soon as possible: Allergic reactions--skin rash,  itching, hives, swelling of the face, lips, tongue, or throat Burning, pain,  tingling, or color changes in the legs or feet Heart attack--pain or tightness in the chest, shoulders, arms, or jaw, nausea, shortness of breath, cold or clammy skin, feeling faint or lightheaded Heart rhythm changes--fast or irregular heartbeat, dizziness, feeling faint or lightheaded, chest pain, trouble breathing Increase in blood pressure Irritability, confusion, fast or irregular heartbeat, muscle stiffness, twitching muscles, sweating, high fever, seizure, chills, vomiting, diarrhea, which may be signs of serotonin syndrome Raynaud's--cool, numb, or painful fingers or toes that may change color from pale, to blue, to red Seizures Stroke--sudden numbness or weakness of the face, arm, or leg, trouble speaking, confusion, trouble walking, loss of balance or coordination, dizziness, severe headache, change in vision Sudden or severe stomach pain, nausea, vomiting, fever, or bloody diarrhea Vision loss Side effects that usually do not require medical attention (report to your care team if they continue or are bothersome): Dizziness General discomfort or fatigue This list may not describe all possible side effects. Call your doctor for medical advice about side effects. You may report side effects to FDA at 1-800-FDA-1088. Where should I keep my medication? Keep out of the reach of children and pets. Store at room temperature between 15 and 30 degrees C (59 and 86 degrees F). Throw away any unused medication after the expiration date. NOTE: This sheet is a summary. It may not cover all possible information. If you have questions about this medicine, talk to your doctor, pharmacist, or health care provider.  2024 Elsevier/Gold Standard (2022-05-12 00:00:00)Galcanezumab Injection What is this medication? GALCANEZUMAB (gal ka NEZ ue mab) prevents migraines. It works by blocking a substance in the body that causes migraines. It may also be used to treat cluster headaches. It is a monoclonal  antibody. This medicine may be used for other purposes; ask your health care provider or pharmacist if you have questions. COMMON BRAND NAME(S): Emgality What should I tell my care team before I take this medication? They need to know if you have any of these conditions: An unusual or allergic reaction to galcanezumab, other medications, foods, dyes, or preservatives Pregnant or trying to get pregnant Breast-feeding How should I use this medication? This medication is injected under the skin. You will be taught how to prepare and give it. Take it as directed on the prescription label. Keep taking it unless your care team tells you to stop. It is important that you put your used needles and syringes in a special sharps container. Do not put them in a trash can. If you do not have a sharps container, call your pharmacist or care team to get one. Talk to your care team about the use of this medication in children. Special care may be needed. Overdosage: If you think you have taken too much of this medicine contact a poison control center or emergency room at once. NOTE: This medicine is only for you. Do not share this medicine with others. What if I miss a dose? If you miss a dose, take it as soon as you can. If it is almost time for your next dose, take only that dose. Do not take double or extra doses. What may interact with this medication? Interactions are not expected. This list may not describe all possible interactions. Give your health care provider a list of all the medicines, herbs, non-prescription drugs, or dietary supplements you use. Also tell them if you smoke, drink alcohol, or use illegal  drugs. Some items may interact with your medicine. What should I watch for while using this medication? Visit your care team for regular checks on your progress. Tell your care team if your symptoms do not start to get better or if they get worse. What side effects may I notice from receiving this  medication? Side effects that you should report to your care team as soon as possible: Allergic reactions or angioedema--skin rash, itching or hives, swelling of the face, eyes, lips, tongue, arms, or legs, trouble swallowing or breathing Side effects that usually do not require medical attention (report to your care team if they continue or are bothersome): Pain, redness, or irritation at injection site This list may not describe all possible side effects. Call your doctor for medical advice about side effects. You may report side effects to FDA at 1-800-FDA-1088. Where should I keep my medication? Keep out of the reach of children and pets. Store in a refrigerator or at room temperature between 20 and 25 degrees C (68 and 77 degrees F). Refrigeration (preferred): Store in the refrigerator. Do not freeze. Keep in the original container until you are ready to take it. Remove the dose from the carton about 30 minutes before it is time for you to use it. If the dose is not used, it may be stored in original container at room temperature for 7 days. Get rid of any unused medication after the expiration date. Room Temperature: This medication may be stored at room temperature for up to 7 days. Keep it in the original container. Protect from light until time of use. If it is stored at room temperature, get rid of any unused medication after 7 days or after it expires, whichever is first. To get rid of medications that are no longer needed or have expired: Take the medication to a medication take-back program. Check with your pharmacy or law enforcement to find a location. If you cannot return the medication, ask your pharmacist or care team how to get rid of this medication safely. NOTE: This sheet is a summary. It may not cover all possible information. If you have questions about this medicine, talk to your doctor, pharmacist, or health care provider.  2024 Elsevier/Gold Standard (2022-02-06  00:00:00)

## 2024-05-26 NOTE — Progress Notes (Signed)
 [facility]    Provider:  Dr Tresia Fruit Requesting Provider: [name], MD Primary Care Provider:  [name], PA-C  CC:  Severe headache  HPI:  Wendy Lynn is a 58 y.o. female here as requested by [name], MD for severe headaches. 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 She has a spinal stimulator. She was working and felt like someone hit her behind the right head. Having headaches for a while. Her face was numb on the right, right eye was blurry, everything happened on the right side, acute, severe, like someone hit her and it was there pounding, pulsating, nausea, almost vomited it was so severe, 10/10, light sensitivity, she scared her husband that day,  She has chronic migraines and headaches, >20 total headache days a month and > 15 total migrane days a month for the last > 3 months, no medication overuse, no aura. She endorses pulsating/pounding/throbbing, light and sound sensitivity, nausea, vomiting, hurts to move, migraines are severe and can last 8-24 hours or more, unilateral but can spread to be holocephalic and other places. They are significantly affecting quality of life with work, family. Mother, brother, daughter have migraines and she has had them they can move around unilateral, move around. She snores very loudly but only some nights, every once in a while wakes up with headaches not routinely, no excessive daytime somnolence. Trying to lose wieght, lifestyle, exercising. No other focal neurologic deficits, associated symptoms, inciting events or modifiable factors.  Reviewed notes, labs and imaging from outside physicians, which showed:  From a thorough review of records and patient report, Medications tried that can be used in migraine/headache management greater than 3 months include: Lifestyle modification, headache diaries, better sleep hygiene, exercise, management of migraine triggers, OTC and prescribed  analgesics/nsaids such as ibuprofen , excedrin , alleve and others, Aimovig contraindicated due to constipation, tylenol , amlodipine, fioricet, flexeril , gabapentin, topiramate, amitriptyline, meloxicam , robaxin , napron, metoprolol, zoloft, trazodone, effexor, sumatriptan, maxalt   CTA H&N Review of the MIP images confirms the above findings   IMPRESSION: 1. No intracranial large or medium vessel occlusion or correctable proximal stenosis. 2. Mild atherosclerotic change at both carotid bifurcations but no stenosis.  IMPRESSION: 1. No acute intracranial abnormality or significant interval change. 2. Aspects is 10/10. 3. 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 This is a nonspecific finding but can be seen in the setting of idiopathic intracranial hypertension.   The above was relayed via text pager to Dr. Cleone Dad on 04/23/2024 at 13:06 .  Reviewed labs b12 434 normal 07/2023  Vitamin 23.1 low is supplementing.   Labs below reviewed unremarkable     Latest Ref Rng & Units 04/23/2024    1:18 PM 08/06/2023    4:32 PM 05/21/2023    3:22 PM  CBC  WBC 4.0 - 10.5 K/uL 8.3  9.0  10.2   Hemoglobin 12.0 - 15.0 g/dL 16.1  09.6  04.5   Hematocrit 36.0 - 46.0 % 38.8  38.8  37.4   Platelets 150 - 400 K/uL 287  270  326       Latest Ref Rng & Units 04/23/2024    1:18 PM 08/23/2023    8:42 AM 08/06/2023    4:32 PM  CMP  Glucose 70 - 99 mg/dL 88  96  98   BUN 6 - 20 mg/dL 11  11  9    Creatinine 0.44 - 1.00 mg/dL 4.09  8.11  9.14   Sodium 135 - 145 mmol/L  139  141  138   Potassium 3.5 - 5.1 mmol/L 4.0  4.2  3.7   Chloride 98 - 111 mmol/L 102  96  103   CO2 22 - 32 mmol/L 25  29  26    Calcium 8.9 - 10.3 mg/dL 9.8  16.1  9.4   Total Protein 6.5 - 8.1 g/dL 7.3   7.5   Total Bilirubin 0.0 - 1.2 mg/dL <0.9   0.2   Alkaline Phos 38 - 126 U/L 98   80   AST 15 - 41 U/L 19   14   ALT 0 - 44 U/L 19   19      Review of Systems: Patient complains of symptoms per HPI as well as the  following symptoms back pain. Pertinent negatives and positives per HPI. All others negative.   Social History   Socioeconomic History   Marital status: Married    Spouse name: Wendy Lynn   Number of children: 2   Years of education: Not on file   Highest education level: Not on file  Occupational History   Not on file  Tobacco Use   Smoking status: Former    Current packs/day: 0.50    Average packs/day: 0.5 packs/day for 27.0 years (13.5 ttl pk-yrs)    Types: Cigarettes   Smokeless tobacco: Never   Tobacco comments:    07-2022 quit smoking.  Vaping Use   Vaping status: Never Used  Substance and Sexual Activity   Alcohol use: No   Drug use: No   Sexual activity: Not on file  Other Topics Concern   Not on file  Social History Narrative   Caffiene 2-3 cups daily, soda 1 daily   Working:  Clinical biochemist rep (remote) [employer]   Lives at home husband.    Social Drivers of Corporate investment banker Strain: Not on file  Food Insecurity: Low Risk  (05/13/2024)   Received from [facility]   Hunger Vital Sign    Worried About Running Out of Food in the Last Year: Never true    Ran Out of Food in the Last Year: Never true  Transportation Needs: No Transportation Needs (05/13/2024)   Received from [facility]    In the past 12 months, has lack of reliable transportation kept you from medical appointments, meetings, work or from getting things needed for daily living? : No  Physical Activity: Not on file  Stress: Not on file  Social Connections: Not on file  Intimate Partner Violence: Not on file    Family History  Problem Relation Age of Onset   Migraines Mother    Heart failure Father    Migraines Brother    Migraines Daughter     Past Medical History:  Diagnosis Date   Asthma    AS A CHILD, GREW OUT OF IT AROUND 24 YR OF AGE   GERD (gastroesophageal reflux disease)    Hypertension    PONV (postoperative nausea and vomiting)    WITH  PREVOUS BACK SURGERY.   Ulcer     Patient Active Problem List   Diagnosis Date Noted   Lumbar foraminal stenosis 08/27/2018    Past Surgical History:  Procedure Laterality Date   ABDOMINAL HYSTERECTOMY     BACK SURGERY     ELBOW SURGERY     LEFT    SACRAL NERVE STIMULATOR PLACEMENT  09/08/2022    Current Outpatient Medications  Medication Sig Dispense Refill   acetaminophen  (  TYLENOL ) 325 MG tablet Take 2 tablets (650 mg total) by mouth every 6 (six) hours as needed. 36 tablet 0   albuterol  (VENTOLIN  HFA) 108 (90 Base) MCG/ACT inhaler Inhale 1 puff into the lungs as needed for wheezing or shortness of breath.     cetirizine (ZYRTEC) 10 MG tablet Take 10 mg by mouth daily as needed for allergies.     cholecalciferol (VITAMIN D3) 25 MCG (1000 UNIT) tablet Take 1,000 Units by mouth daily.     eletriptan (RELPAX) 40 MG tablet Take 1 tablet (40 mg total) by mouth as needed for migraine or headache. May repeat in 2 hours if headache persists or recurs. 9 tablet 11   famotidine (PEPCID) 40 MG tablet Take 20 mg by mouth 2 (two) times daily.     Galcanezumab-gnlm (EMGALITY) 120 MG/ML SOAJ Inject 240 mg into the skin once for 1 dose. This is loading dose, please fill this first. First month is 2 injections and then one injection every subsequent month. Loading dose fill first. This is first month dose inject two. Every month thereafter inject once monthly. Please use copay card: BIN [id number] PCN PDMI GRP [id number] ID [id number] EXP 12/24/2024 2 mL 11   ibuprofen  (ADVIL ) 200 MG tablet Take 200 mg by mouth every 6 (six) hours as needed.     montelukast (SINGULAIR) 10 MG tablet Take 10 mg by mouth at bedtime.     omeprazole (PRILOSEC) 40 MG capsule Take 40 mg by mouth every morning.     sertraline (ZOLOFT) 50 MG tablet Take 1 tablet by mouth daily.     spironolactone  (ALDACTONE ) 25 MG tablet Take 1 tablet (25 mg total) by mouth daily. 90 tablet 3   Galcanezumab-gnlm (EMGALITY) 120 MG/ML SOAJ Inject  120 mg into the skin every 30 (thirty) days. First month was 2 injections. Every subsequent month is one injection. PLEASE RUN COPAY CARD BIN [id number] PCN PDMI GRP [id number] ID [id number] EXP 12/24/2024 1 mL 11   No current facility-administered medications for this visit.    Allergies as of 05/26/2024 - Review Complete 05/26/2024  Allergen Reaction Noted   Flonase [fluticasone]  03/20/2023    Vitals: BP (!) 153/83 (Cuff Size: Normal) Comment: lower forearm normla size cuff. heart level  Pulse (!) 51   Ht 5\' 2"  (1.575 m)   Wt 257 lb 12.8 oz (116.9 kg)   BMI 47.15 kg/m  Last Weight:  Wt Readings from Last 1 Encounters:  05/26/24 257 lb 12.8 oz (116.9 kg)   Last Height:   Ht Readings from Last 1 Encounters:  05/26/24 5\' 2"  (1.575 m)     Physical exam: Exam: Gen: NAD, conversant, well nourised, obese, well groomed                     CV: RRR, no MRG. No Carotid Bruits. No peripheral edema, warm, nontender Eyes: Conjunctivae clear without exudates or hemorrhage  Neuro: Detailed Neurologic Exam  Speech:    Speech is normal; fluent and spontaneous with normal comprehension.  Cognition:    The patient is oriented to person, place, and time;     recent and remote memory intact;     language fluent;     normal attention, concentration,     fund of knowledge Cranial Nerves:    The pupils are equal, round, and reactive to light. The fundi are normal and spontaneous venous pulsations are present. Visual fields are full to finger confrontation. Extraocular movements are intact. Trigeminal  sensation is intact and the muscles of mastication are normal. The face is symmetric. The palate elevates in the midline. Hearing intact. Voice is normal. Shoulder shrug is normal. The tongue has normal motion without fasciculations.   Coordination: nml  Gait: nml  Motor Observation:    No asymmetry, no atrophy, and no involuntary movements noted. Tone:    Normal muscle tone.    Posture:     Posture is normal. normal erect    Strength: mild weakness right leg(chronic) otherwsie strength is V/V in the upper and lower limbs.      Sensation: intact to LT     Reflex Exam:  DTR's:    Deep tendon reflexes in the upper and lower extremities are symmetrical bilaterally.   Toes:    The toes are equiv bilaterally.   Clonus:    Clonus is absent.    Assessment/Plan:  Patient with Chronic Migraines. Has failed multiple medications.    Prevention: Start monthly injection Has Raina Bunting will start Emgality.  Acute management: Relpax - newer medications are nurtec and ubrelvy    Meds ordered this encounter  Medications   eletriptan (RELPAX) 40 MG tablet    Sig: Take 1 tablet (40 mg total) by mouth as needed for migraine or headache. May repeat in 2 hours if headache persists or recurs.    Dispense:  9 tablet    Refill:  11   DISCONTD: Galcanezumab-gnlm (EMGALITY) 120 MG/ML SOAJ    Sig: Inject 240 mg into the skin once for 1 dose. PLEASE RUN COPAY CARD BIN [id number] PCN PDMI GRP [id number] ID [id number] EXP 12/24/2024    Dispense:  2 mL    Refill:  0    PLEASE RUN COPAY CARD BIN [id number] PCN PDMI GRP [id number] ID [id number] EXP 12/24/2024   Galcanezumab-gnlm (EMGALITY) 120 MG/ML SOAJ    Sig: Inject 240 mg into the skin once for 1 dose. This is loading dose, please fill this first. First month is 2 injections and then one injection every subsequent month. Loading dose fill first. This is first month dose inject two. Every month thereafter inject once monthly. Please use copay card: BIN [id number] PCN PDMI GRP [id number] ID [id number] EXP 12/24/2024    Dispense:  2 mL    Refill:  11    Please use copay card: BIN [id number] PCN PDMI GRP [id number] ID [id number] EXP 12/24/2024   Galcanezumab-gnlm (EMGALITY) 120 MG/ML SOAJ    Sig: Inject 120 mg into the skin every 30 (thirty) days. First month was 2 injections. Every subsequent month is one injection. PLEASE RUN COPAY CARD BIN [id number] PCN PDMI GRP  [id number] ID [id number] EXP 12/24/2024    Dispense:  1 mL    Refill:  11    PLEASE RUN COPAY CARD BIN [id number] PCN PDMI GRP [id number] ID [id number] EXP 12/24/2024    Cc: [name], MD,  [name], PA-C  Aldona Amel, MD  [facility] [address] [place], [place] [address]  Phone [phone] Fax [phone]

## 2024-05-26 NOTE — Telephone Encounter (Signed)
 Pharmacy Patient Advocate Encounter   Received notification from CoverMyMeds that prior authorization for Emgality 120MG /ML auto-injectors (migraine) is required/requested.   Insurance verification completed.   The patient is insured through [facility] .   Per test claim: PA required; PA submitted to above mentioned insurance via CoverMyMeds Key/confirmation #/EOC [id number] Status is pending

## 2024-05-26 NOTE — Telephone Encounter (Signed)
 Pharmacy Patient Advocate Encounter  Received notification from [facility] that Prior Authorization for Emgality 120MG /ML auto-injectors (migraine) has been APPROVED from 05/29/2024 to 08/26/2024. Unable to obtain price due to refill too soon rejection, last fill date 05/26/2024 next available fill date6/23/2025   PA #/Case ID/Reference #: N/A

## 2024-05-27 MED ORDER — EMGALITY 120 MG/ML ~~LOC~~ SOAJ: 120.0000 mg | SUBCUTANEOUS | 11 refills | Status: AC

## 2024-05-27 MED ORDER — EMGALITY 120 MG/ML ~~LOC~~ SOAJ: 240.0000 mg | Freq: Once | SUBCUTANEOUS | 11 refills | Status: AC

## 2024-08-26 ENCOUNTER — Telehealth: Payer: Self-pay

## 2024-08-26 ENCOUNTER — Other Ambulatory Visit (HOSPITAL_COMMUNITY): Payer: Self-pay

## 2024-08-26 NOTE — Telephone Encounter (Signed)
 It is time to renew PA-PT has not been evaluated since starting the medication and insurance is looking for documentation o how the medication is working for the PT-please see below.

## 2024-08-27 NOTE — Telephone Encounter (Signed)
 Lvm 1st attempt by hf

## 2024-08-28 NOTE — Telephone Encounter (Signed)
 Lmvm for pt concerning trying to get reauthorization of injectable need this information.

## 2024-09-25 ENCOUNTER — Telehealth: Admitting: Neurology

## 2024-09-29 ENCOUNTER — Telehealth: Payer: Self-pay | Admitting: Neurology

## 2024-09-29 NOTE — Telephone Encounter (Signed)
 LVM and sent mychart msg informing pt of need to reschedule 10/10/24 appt - MD departure

## 2024-10-10 ENCOUNTER — Telehealth: Admitting: Neurology
